# Patient Record
Sex: Male | Born: 1975 | Hispanic: No | Marital: Married | Smoking: Never smoker
Health system: Southern US, Community
[De-identification: ages and names within clinical notes are randomized; demographics above are authoritative.]

---

## 2001-06-09 ENCOUNTER — Encounter: Payer: Self-pay | Admitting: Occupational Medicine

## 2001-06-09 ENCOUNTER — Encounter: Admission: RE | Admit: 2001-06-09 | Discharge: 2001-06-09 | Payer: Self-pay | Admitting: Occupational Medicine

## 2001-09-11 ENCOUNTER — Encounter: Admission: RE | Admit: 2001-09-11 | Discharge: 2001-09-11 | Payer: Self-pay | Admitting: Specialist

## 2001-09-11 ENCOUNTER — Encounter: Payer: Self-pay | Admitting: Specialist

## 2001-11-02 ENCOUNTER — Emergency Department (HOSPITAL_COMMUNITY): Admission: EM | Admit: 2001-11-02 | Discharge: 2001-11-02 | Payer: Self-pay

## 2002-12-28 ENCOUNTER — Encounter: Admission: RE | Admit: 2002-12-28 | Discharge: 2002-12-28 | Payer: Self-pay | Admitting: Specialist

## 2002-12-28 ENCOUNTER — Encounter: Payer: Self-pay | Admitting: Specialist

## 2003-10-02 ENCOUNTER — Emergency Department (HOSPITAL_COMMUNITY): Admission: EM | Admit: 2003-10-02 | Discharge: 2003-10-02 | Payer: Self-pay | Admitting: Family Medicine

## 2004-04-03 ENCOUNTER — Emergency Department (HOSPITAL_COMMUNITY): Admission: EM | Admit: 2004-04-03 | Discharge: 2004-04-03 | Payer: Self-pay | Admitting: Emergency Medicine

## 2004-04-06 ENCOUNTER — Emergency Department (HOSPITAL_COMMUNITY): Admission: EM | Admit: 2004-04-06 | Discharge: 2004-04-06 | Payer: Self-pay | Admitting: Family Medicine

## 2004-07-13 ENCOUNTER — Encounter: Admission: RE | Admit: 2004-07-13 | Discharge: 2004-07-13 | Payer: Self-pay | Admitting: Family Medicine

## 2004-08-20 ENCOUNTER — Emergency Department (HOSPITAL_COMMUNITY): Admission: EM | Admit: 2004-08-20 | Discharge: 2004-08-21 | Payer: Self-pay | Admitting: Emergency Medicine

## 2004-10-25 ENCOUNTER — Emergency Department (HOSPITAL_COMMUNITY): Admission: AD | Admit: 2004-10-25 | Discharge: 2004-10-25 | Payer: Self-pay | Admitting: Family Medicine

## 2007-01-31 ENCOUNTER — Emergency Department (HOSPITAL_COMMUNITY): Admission: EM | Admit: 2007-01-31 | Discharge: 2007-01-31 | Payer: Self-pay | Admitting: Emergency Medicine

## 2010-09-16 ENCOUNTER — Inpatient Hospital Stay (INDEPENDENT_AMBULATORY_CARE_PROVIDER_SITE_OTHER)
Admission: RE | Admit: 2010-09-16 | Discharge: 2010-09-16 | Disposition: A | Discharge status: Home / Self Care | Payer: BC Managed Care – PPO | Source: Ambulatory Visit | Attending: Family Medicine | Admitting: Family Medicine

## 2010-09-16 DIAGNOSIS — J40 Bronchitis, not specified as acute or chronic: Secondary | ICD-10-CM

## 2010-09-16 DIAGNOSIS — R198 Other specified symptoms and signs involving the digestive system and abdomen: Secondary | ICD-10-CM

## 2011-03-26 LAB — I-STAT 8, (EC8 V) (CONVERTED LAB)
Acid-Base Excess: 3 — ABNORMAL HIGH
BUN: 12
Bicarbonate: 30.7 — ABNORMAL HIGH
Chloride: 105
Glucose, Bld: 95
HCT: 48
Hemoglobin: 16.3
Operator id: 146091
Potassium: 4.1
Sodium: 138
TCO2: 32
pCO2, Ven: 58 — ABNORMAL HIGH
pH, Ven: 7.332 — ABNORMAL HIGH

## 2011-03-26 LAB — POCT I-STAT CREATININE
Creatinine, Ser: 1.3
Operator id: 146091

## 2011-03-26 LAB — D-DIMER, QUANTITATIVE: D-Dimer, Quant: <0.22

## 2011-03-26 LAB — POCT CARDIAC MARKERS
Myoglobin, poc: 53.9
Operator id: 146091

## 2011-05-24 ENCOUNTER — Other Ambulatory Visit: Payer: Self-pay | Admitting: Gastroenterology

## 2011-05-24 DIAGNOSIS — B1910 Unspecified viral hepatitis B without hepatic coma: Secondary | ICD-10-CM

## 2011-05-26 ENCOUNTER — Other Ambulatory Visit: Payer: BC Managed Care – PPO

## 2011-05-31 ENCOUNTER — Ambulatory Visit
Admission: RE | Admit: 2011-05-31 | Discharge: 2011-05-31 | Disposition: A | Discharge status: Home / Self Care | Payer: BC Managed Care – PPO | Source: Ambulatory Visit | Attending: Gastroenterology | Admitting: Gastroenterology

## 2011-05-31 DIAGNOSIS — B1910 Unspecified viral hepatitis B without hepatic coma: Secondary | ICD-10-CM

## 2011-06-01 ENCOUNTER — Other Ambulatory Visit: Payer: BC Managed Care – PPO

## 2013-09-13 ENCOUNTER — Other Ambulatory Visit: Payer: Self-pay | Admitting: Family Medicine

## 2013-09-13 DIAGNOSIS — R809 Proteinuria, unspecified: Secondary | ICD-10-CM

## 2013-09-17 ENCOUNTER — Ambulatory Visit
Admission: RE | Admit: 2013-09-17 | Discharge: 2013-09-17 | Disposition: A | Discharge status: Home / Self Care | Payer: BC Managed Care – PPO | Source: Ambulatory Visit | Attending: Family Medicine | Admitting: Family Medicine

## 2013-09-17 DIAGNOSIS — R809 Proteinuria, unspecified: Secondary | ICD-10-CM

## 2013-09-19 ENCOUNTER — Other Ambulatory Visit: Payer: BC Managed Care – PPO

## 2015-12-14 ENCOUNTER — Ambulatory Visit (HOSPITAL_COMMUNITY)
Admission: EM | Admit: 2015-12-14 | Discharge: 2015-12-14 | Disposition: A | Discharge status: Home / Self Care | Payer: Self-pay | Attending: Emergency Medicine | Admitting: Emergency Medicine

## 2015-12-14 ENCOUNTER — Encounter (HOSPITAL_COMMUNITY): Payer: Self-pay | Admitting: Emergency Medicine

## 2015-12-14 DIAGNOSIS — K6289 Other specified diseases of anus and rectum: Secondary | ICD-10-CM | POA: Insufficient documentation

## 2015-12-14 DIAGNOSIS — N50819 Testicular pain, unspecified: Secondary | ICD-10-CM | POA: Insufficient documentation

## 2015-12-14 DIAGNOSIS — R300 Dysuria: Secondary | ICD-10-CM | POA: Insufficient documentation

## 2015-12-14 LAB — POCT URINALYSIS DIP (DEVICE)
Bilirubin Urine: NEGATIVE
GLUCOSE, UA: NEGATIVE mg/dL
Hgb urine dipstick: NEGATIVE
KETONES UR: NEGATIVE mg/dL
LEUKOCYTES UA: NEGATIVE
Nitrite: NEGATIVE
Protein, ur: 30 mg/dL — AB
SPECIFIC GRAVITY, URINE: 1.015 (ref 1.005–1.030)
UROBILINOGEN UA: 0.2 mg/dL (ref 0.0–1.0)
pH: 6 (ref 5.0–8.0)

## 2015-12-14 MED ORDER — LEVOFLOXACIN 500 MG PO TABS: 500.0000 mg | ORAL_TABLET | Freq: Every day | ORAL | Status: DC

## 2015-12-14 MED ORDER — HYDROCORTISONE ACE-PRAMOXINE 2.5-1 % EX CREA: TOPICAL_CREAM | CUTANEOUS | Status: DC

## 2015-12-14 NOTE — Discharge Instructions (Signed)
You may have an infection called epididymitis. Take Levaquin daily for 10 days. I will call you with the results of your testing in 2-3 days.  The anal pain may just be from driving long distances. I do recommend starting on a doughnut pillow. Use the cream twice a day for one week to see if that is beneficial.  If things are not improving over the next week, please follow-up with Dr. Vernie Ammonsttelin in urology.

## 2015-12-14 NOTE — ED Notes (Signed)
Testicular pain and rectal pain that started a month ago, intermittent, pain is more often now.  Denies urination issues.  Reports normal bm today.

## 2015-12-14 NOTE — ED Provider Notes (Signed)
CSN: [id number]     Arrival date & time 12/14/15  1430 History   First MD Initiated Contact with Patient 12/14/15 1519     Chief Complaint  Patient presents with  . Testicle Pain   (Consider location/radiation/quality/duration/timing/severity/associated sxs/prior Treatment) HPI He is a 40 year old man here for evaluation of testicular pain and anal pain. He states this has been going on for over a month, but has become more constant in the last 10 days. Initially, the pain was primarily in his left testicle, but now it is in both and more constant. Pain seems to be mostly at the superior aspect of the testicle. It is improved with elevating the testicles. He does report a little bit of burning with urination. The anal pain he describes as a soreness inside. He denies any constipation or straining. No known blood in the stool. 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  History reviewed. No pertinent past medical history. History reviewed. No pertinent past surgical history. No family history on file. Social History  Substance Use Topics  . Smoking status: Never Smoker   . Smokeless tobacco: None  . Alcohol Use: No    Review of Systems As in history of present illness Allergies  Review of patient's allergies indicates no known allergies.  Home Medications   Prior to Admission medications   Medication Sig Start Date End Date Taking? Authorizing Provider  Naproxen Sodium (ALEVE PO) Take by mouth.   Yes Historical Provider, MD  levofloxacin (LEVAQUIN) 500 MG tablet Take 1 tablet (500 mg total) by mouth daily. 12/14/15   Charm RingsErin J Shakiyah Cirilo, MD  Pramoxine-HC (HYDROCORTISONE ACE-PRAMOXINE) 2.5-1 % CREA Apply to rectum twice a day for 1 week. 12/14/15   Charm RingsErin J Colby Catanese, MD   Meds Ordered and Administered this Visit  Medications - No data to display  BP 137/81 mmHg  Pulse 64  Temp(Src) 98.6 F (37 C) (Oral)  Resp 16  SpO2 100% No data found.   Physical Exam  Constitutional: He  is oriented to person, place, and time. He appears well-developed and well-nourished. No distress.  Cardiovascular: Normal rate.   Pulmonary/Chest: Effort normal.  Abdominal: Hernia confirmed negative in the right inguinal area and confirmed negative in the left inguinal area.  Genitourinary: Penis normal. Rectal exam shows tenderness. Rectal exam shows no external hemorrhoid, no internal hemorrhoid, no fissure and anal tone normal. Prostate is not enlarged. Right testis shows tenderness (at superior pole). Right testis shows no mass and no swelling. Left testis shows tenderness (At superior pole). Left testis shows no mass and no swelling. Circumcised. No penile erythema or penile tenderness. No discharge found.  Lymphadenopathy:       Right: No inguinal adenopathy present.       Left: No inguinal adenopathy present.  Neurological: He is alert and oriented to person, place, and time.    ED Course  Procedures (including critical care time)  Labs Review Labs Reviewed  POCT URINALYSIS DIP (DEVICE) - Abnormal; Notable for the following:    Protein, ur 30 (*)    All other components within normal limits  URINE CULTURE  URINE CYTOLOGY ANCILLARY ONLY    Imaging Review No results found.   MDM   1. Testicular pain   2. Anal pain    As the pain is primarily over the epididymis and he does report some slight dysuria, will treat for epididymitis with Levaquin for 10 days. No obvious cause of his anal pain on  exam. Will trial hydrocortisone-pramoxine cream for 1 week.  Also recommended adding on a donut pillow. If symptoms are not improving, follow-up with urology.    Charm RingsErin J Xander Jutras, MD 12/14/15 [phone]

## 2015-12-15 LAB — URINE CYTOLOGY ANCILLARY ONLY
Chlamydia: NEGATIVE
NEISSERIA GONORRHEA: NEGATIVE
TRICH (WINDOWPATH): NEGATIVE

## 2015-12-15 LAB — URINE CULTURE: Culture: NO GROWTH

## 2015-12-20 ENCOUNTER — Telehealth (HOSPITAL_COMMUNITY): Payer: Self-pay | Admitting: Emergency Medicine

## 2015-12-20 NOTE — ED Notes (Signed)
Called pt and notified of recent lab results from visit 7/2 Pt ID'd properly... Reports feeling better but still having mild pain Also states he has not started the antibiotics b/c of problems w/pharmacy Adv pt to start the antibiotics as soon as he can and to see how is doing after treatment. Adv pt if sx are not getting better to return or to f/u w/urologist.  Pt verb understanding  Per Dr. Piedad ClimesHonig,   Notes Recorded by Charm RingsErin J Honig, MD on 12/16/2015 at 11:01 AM STD testing and urine culture are negative. If he has persistent pain after completing antibiotics, he should f/u with urology

## 2017-08-11 ENCOUNTER — Other Ambulatory Visit (HOSPITAL_COMMUNITY): Payer: Self-pay | Admitting: Gastroenterology

## 2017-08-11 DIAGNOSIS — B181 Chronic viral hepatitis B without delta-agent: Secondary | ICD-10-CM

## 2017-09-21 ENCOUNTER — Encounter (HOSPITAL_COMMUNITY): Payer: Self-pay

## 2017-09-21 ENCOUNTER — Ambulatory Visit (HOSPITAL_COMMUNITY): Payer: BLUE CROSS/BLUE SHIELD

## 2017-09-27 ENCOUNTER — Ambulatory Visit (HOSPITAL_COMMUNITY): Payer: BLUE CROSS/BLUE SHIELD

## 2017-09-30 ENCOUNTER — Ambulatory Visit (HOSPITAL_COMMUNITY): Payer: BLUE CROSS/BLUE SHIELD

## 2017-10-07 ENCOUNTER — Ambulatory Visit (HOSPITAL_COMMUNITY)
Admission: RE | Admit: 2017-10-07 | Discharge: 2017-10-07 | Disposition: A | Discharge status: Home / Self Care | Payer: BLUE CROSS/BLUE SHIELD | Source: Ambulatory Visit | Attending: Gastroenterology | Admitting: Gastroenterology

## 2017-10-07 DIAGNOSIS — B181 Chronic viral hepatitis B without delta-agent: Secondary | ICD-10-CM | POA: Diagnosis present

## 2017-11-20 ENCOUNTER — Other Ambulatory Visit: Payer: Self-pay | Admitting: Nurse Practitioner

## 2017-11-20 DIAGNOSIS — N644 Mastodynia: Secondary | ICD-10-CM

## 2017-11-20 DIAGNOSIS — N63 Unspecified lump in unspecified breast: Secondary | ICD-10-CM

## 2017-11-23 ENCOUNTER — Ambulatory Visit
Admission: RE | Admit: 2017-11-23 | Discharge: 2017-11-23 | Disposition: A | Discharge status: Home / Self Care | Payer: BLUE CROSS/BLUE SHIELD | Source: Ambulatory Visit | Attending: Nurse Practitioner | Admitting: Nurse Practitioner

## 2017-11-23 ENCOUNTER — Ambulatory Visit: Payer: BLUE CROSS/BLUE SHIELD

## 2017-11-23 DIAGNOSIS — N644 Mastodynia: Secondary | ICD-10-CM

## 2017-11-23 DIAGNOSIS — N63 Unspecified lump in unspecified breast: Secondary | ICD-10-CM

## 2017-11-25 ENCOUNTER — Other Ambulatory Visit: Payer: BLUE CROSS/BLUE SHIELD

## 2018-05-30 ENCOUNTER — Other Ambulatory Visit: Payer: Self-pay | Admitting: Gastroenterology

## 2018-05-30 DIAGNOSIS — B181 Chronic viral hepatitis B without delta-agent: Secondary | ICD-10-CM

## 2018-05-30 DIAGNOSIS — R772 Abnormality of alphafetoprotein: Secondary | ICD-10-CM

## 2018-07-29 ENCOUNTER — Inpatient Hospital Stay: Admission: RE | Admit: 2018-07-29 | Payer: BLUE CROSS/BLUE SHIELD | Source: Ambulatory Visit

## 2019-01-26 ENCOUNTER — Other Ambulatory Visit: Payer: BLUE CROSS/BLUE SHIELD

## 2019-07-27 ENCOUNTER — Other Ambulatory Visit: Payer: Self-pay | Admitting: Gastroenterology

## 2019-07-27 DIAGNOSIS — R772 Abnormality of alphafetoprotein: Secondary | ICD-10-CM

## 2019-07-27 DIAGNOSIS — B181 Chronic viral hepatitis B without delta-agent: Secondary | ICD-10-CM

## 2019-08-06 ENCOUNTER — Other Ambulatory Visit: Payer: Self-pay | Admitting: Gastroenterology

## 2019-08-06 DIAGNOSIS — R772 Abnormality of alphafetoprotein: Secondary | ICD-10-CM

## 2020-06-25 ENCOUNTER — Other Ambulatory Visit (HOSPITAL_COMMUNITY): Payer: Self-pay | Admitting: Gastroenterology

## 2020-06-25 ENCOUNTER — Other Ambulatory Visit: Payer: Self-pay | Admitting: Gastroenterology

## 2020-06-25 DIAGNOSIS — B181 Chronic viral hepatitis B without delta-agent: Secondary | ICD-10-CM

## 2020-06-25 DIAGNOSIS — R772 Abnormality of alphafetoprotein: Secondary | ICD-10-CM

## 2020-06-28 ENCOUNTER — Ambulatory Visit (HOSPITAL_COMMUNITY)
Admission: RE | Admit: 2020-06-28 | Discharge: 2020-06-28 | Disposition: A | Discharge status: Home / Self Care | Payer: 59 | Source: Ambulatory Visit | Attending: Gastroenterology | Admitting: Gastroenterology

## 2020-06-28 ENCOUNTER — Other Ambulatory Visit: Payer: Self-pay

## 2020-06-28 DIAGNOSIS — B181 Chronic viral hepatitis B without delta-agent: Secondary | ICD-10-CM | POA: Insufficient documentation

## 2020-06-28 DIAGNOSIS — R772 Abnormality of alphafetoprotein: Secondary | ICD-10-CM | POA: Diagnosis present

## 2020-06-28 MED ORDER — GADOBUTROL 1 MMOL/ML IV SOLN
10.0000 mL | Freq: Once | INTRAVENOUS | Status: AC | PRN
  Administered 2020-06-28: 10 mL via INTRAVENOUS

## 2022-02-21 IMAGING — MR MR ABDOMEN WO/W CM
18 series · 47 of 48 positions shown · IV contrast (10mL GADAVIST)
Comparison: 10/07/2017 abdominal sonogram. 05/14/2005 CT
abdomen/pelvis.

CLINICAL DATA: Chronic hepatitis-B. Elevated AFP. No acute symptoms
reported.

EXAM:
MRI ABDOMEN WITHOUT AND WITH CONTRAST
TECHNIQUE: Multiplanar multisequence MR imaging of the abdomen was performed
both before and after the administration of intravenous contrast.
CONTRAST:  10mL GADAVIST GADOBUTROL 1 MMOL/ML IV SOLN

[Series 3: T2 · coronal · 6.0mm · 1.56mm/px · 2 of 40 slices shown (1 of 2)]
[im 1/40]
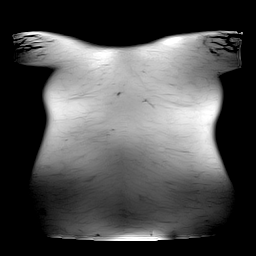
[im 40/40]
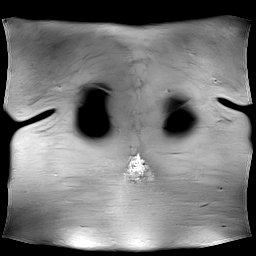

[Series 4: T2 fat-sat · axial · 6.0mm · 1.25mm/px · z∈[-113,+168]mm · 2 of 40 slices shown]
[im 1/40]
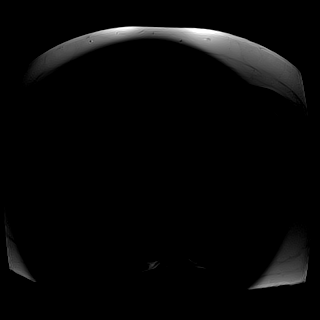
[im 40/40]
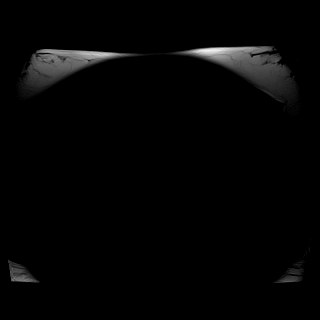

[Series 6: T1 · axial · 3.0mm · 1.25mm/px · z∈[-91,+170]mm · 4 of 88 slices shown (1 of 2)]
[im 1/88]
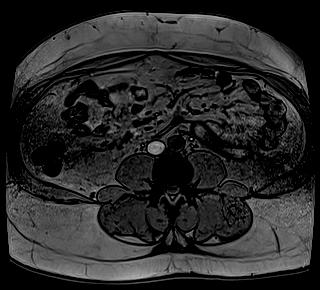
[im 30/88]
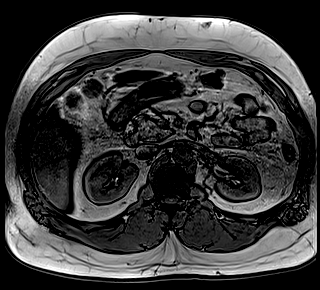
[im 59/88]
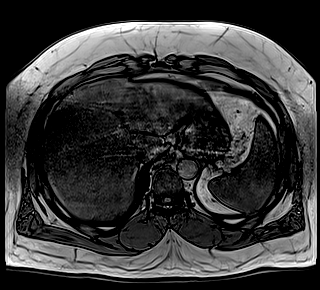
[im 88/88]
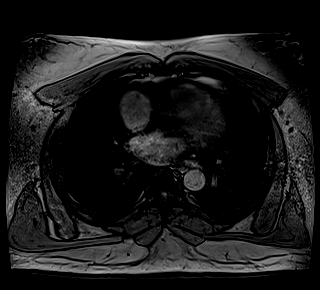

[Series 7: T1 · axial · 3.0mm · 1.25mm/px · z∈[-91,+170]mm · 3 of 88 slices shown (2 of 2)]
[im 1/88]
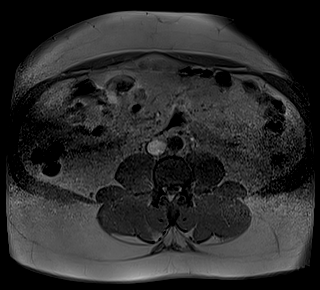
[im 44/88]
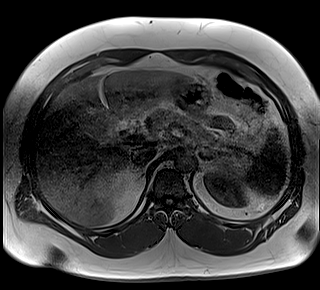
[im 88/88]
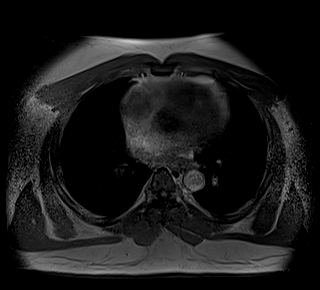

[Series 8: DWI · axial · 6.0mm · 1.49mm/px · z∈[-101,+151]mm · 3 of 72 slices shown (1 of 2)]
[im 1/72]
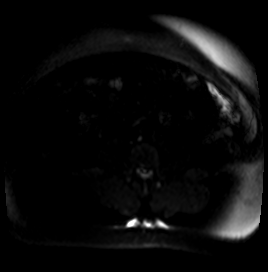
[im 36/72]
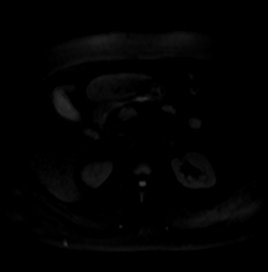
[im 72/72]
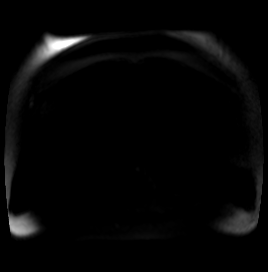

[Series 9: DWI · axial · 6.0mm · 1.49mm/px · 1 of 36 slices shown (2 of 2)]
[im 1/36]
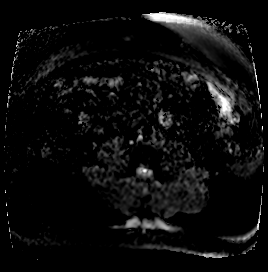

[Series 10: bSSFP · axial · 4.0mm · 0.78mm/px · z∈[-107,+153]mm · 2 of 66 slices shown]
[im 1/66]
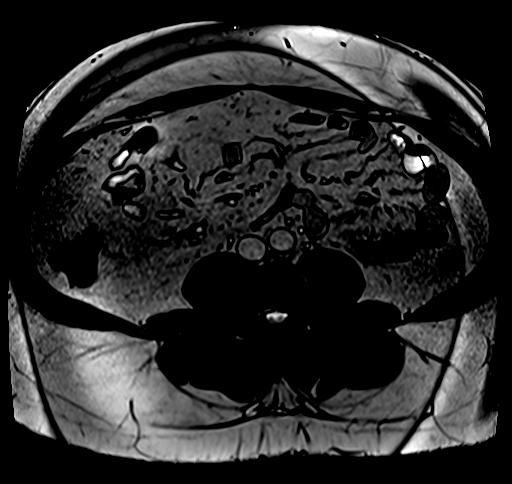
[im 66/66]
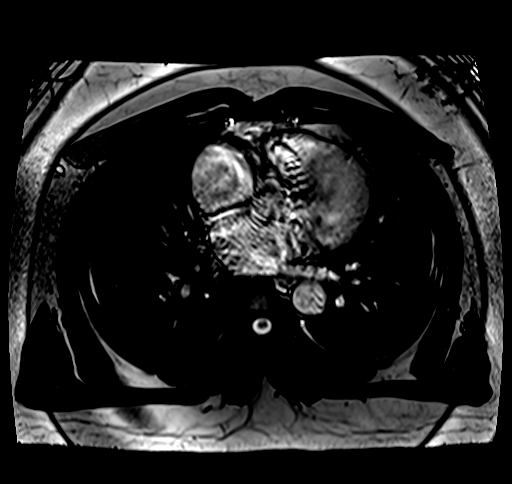

[Series 12: T1 dynamic · axial · 3.0mm · 1.25mm/px · z∈[-99,+162]mm · 3 of 88 slices shown (1 of 10)]
[im 1/88]
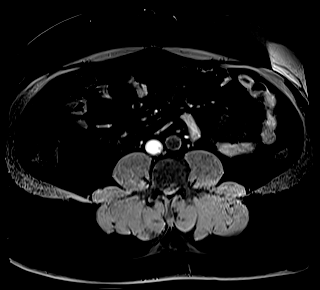
[im 44/88]
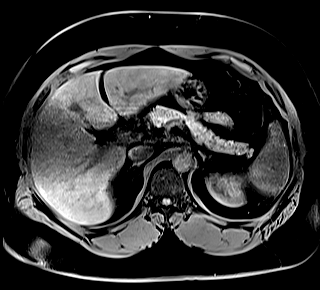
[im 88/88]
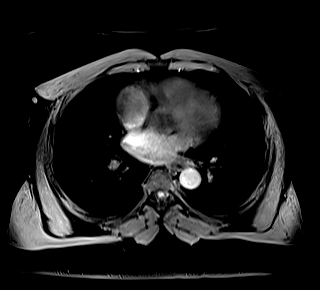

[Series 16: T1 dynamic · axial · 3.0mm · 1.25mm/px · z∈[-99,+162]mm · 3 of 88 slices shown (2 of 10)]
[im 1/88]
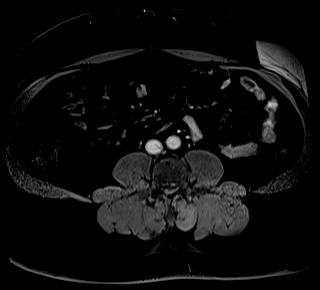
[im 44/88]
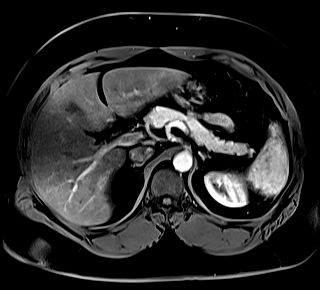
[im 88/88]
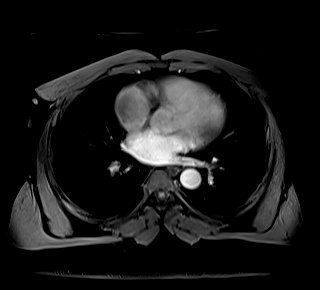

[Series 17: T1 dynamic · axial · 3.0mm · 1.25mm/px · z∈[-99,+162]mm · 3 of 88 slices shown (3 of 10)]
[im 1/88]
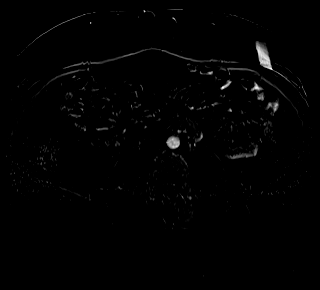
[im 44/88]
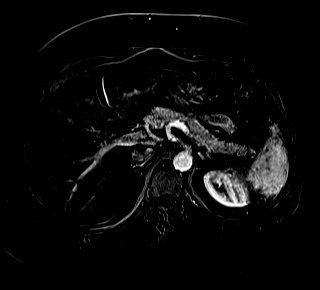
[im 88/88]
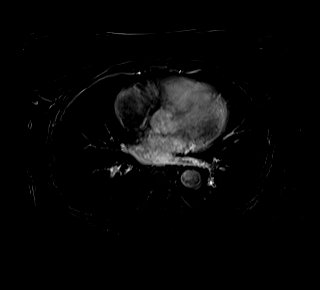

[Series 20: T1 dynamic · axial · 3.0mm · 1.25mm/px · z∈[-99,+162]mm · 3 of 88 slices shown (4 of 10)]
[im 1/88]
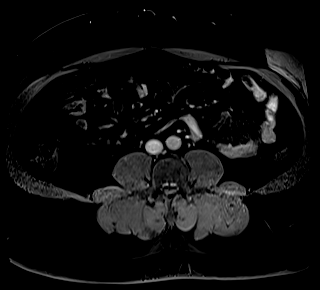
[im 44/88]
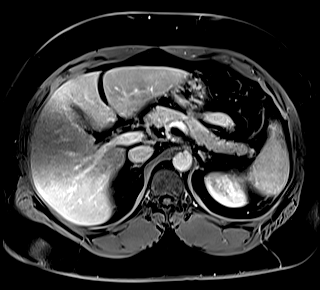
[im 88/88]
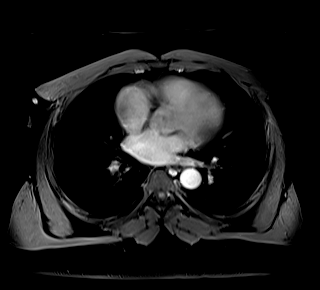

[Series 21: T1 dynamic · axial · 3.0mm · 1.25mm/px · z∈[-99,+162]mm · 3 of 88 slices shown (5 of 10)]
[im 1/88]
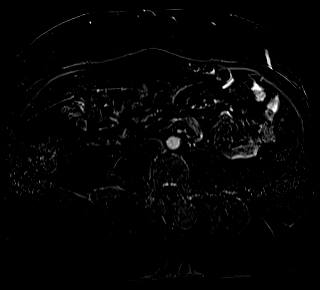
[im 44/88]
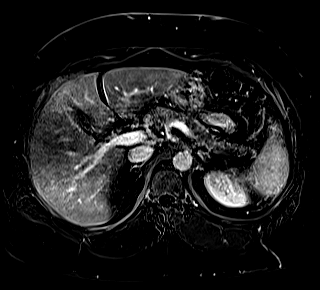
[im 88/88]
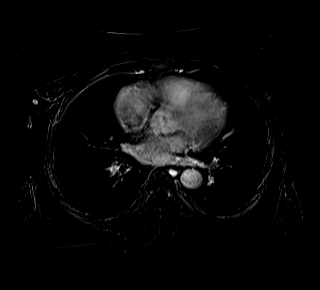

[Series 24: T1 dynamic · axial · 3.0mm · 1.25mm/px · z∈[-99,+162]mm · 3 of 88 slices shown (6 of 10)]
[im 1/88]
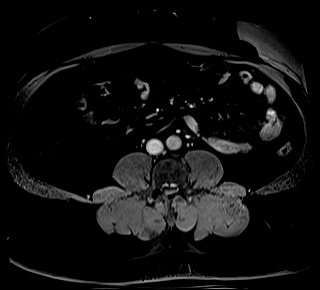
[im 44/88]
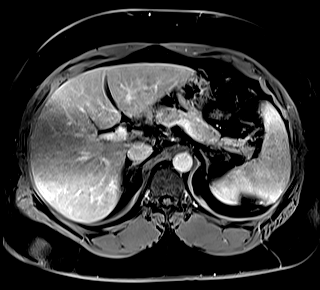
[im 88/88]
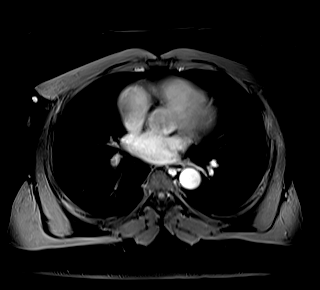

[Series 25: T1 dynamic · axial · 3.0mm · 1.25mm/px · z∈[-99,+162]mm · 3 of 88 slices shown (7 of 10)]
[im 1/88]
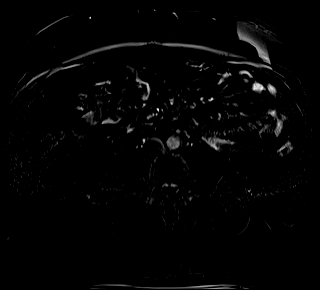
[im 44/88]
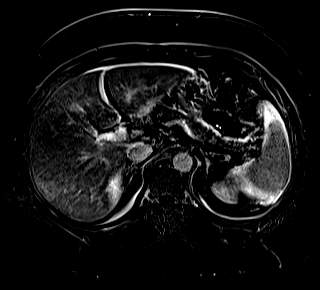
[im 88/88]
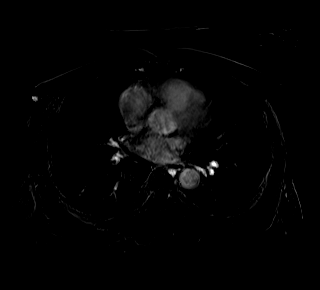

[Series 27: T1 dynamic · coronal · 4.0mm · 1.25mm/px · 3 of 72 slices shown (8 of 10)]
[im 1/72]
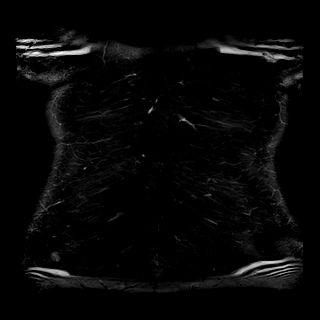
[im 36/72]
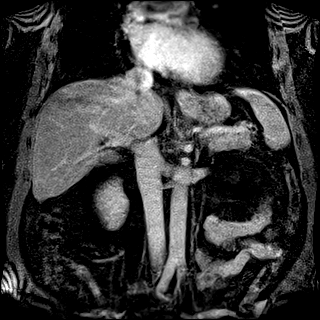
[im 72/72]
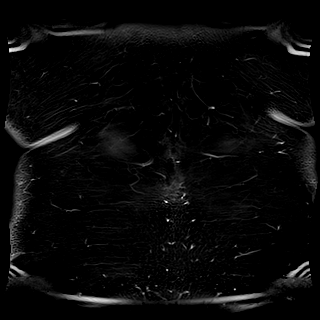

[Series 28: T2 · axial · 6.0mm · 1.56mm/px · 1 of 37 slices shown (2 of 2)]
[im 1/37]
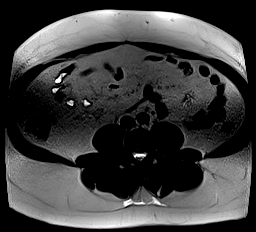

[Series 31: T1 dynamic · axial · 3.0mm · 1.25mm/px · z∈[-99,+162]mm · 3 of 88 slices shown (9 of 10)]
[im 1/88]
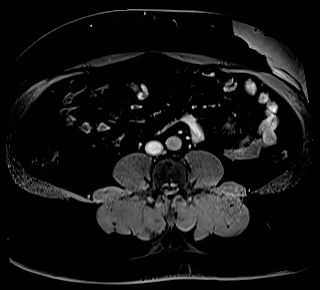
[im 44/88]
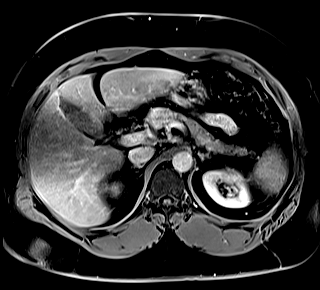
[im 88/88]
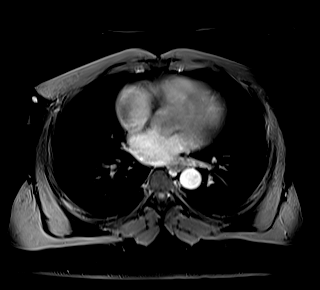

[Series 32: T1 dynamic · axial · 3.0mm · 1.25mm/px · z∈[-99,+30]mm · 2 of 88 slices shown (10 of 10)]
[im 1/88]
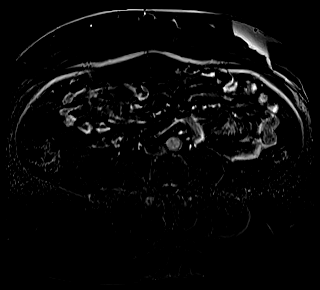
[im 44/88]
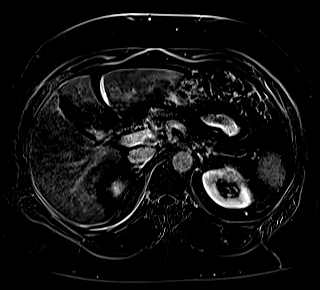

[47 of 48 positions shown; findings below may reference images not displayed]

FINDINGS: Lower chest: No acute abnormality at the lung bases.

Hepatobiliary: Normal liver size. Mildly heterogeneous liver
parenchyma. No definite liver surface irregularity. Mild diffuse
hepatic steatosis. No liver mass. Normal gallbladder with no
cholelithiasis. No biliary ductal dilatation. Common bile duct
diameter 2 mm. No choledocholithiasis. No biliary masses, strictures
or beading.

Pancreas: No pancreatic mass or duct dilation.  No pancreas divisum.

Spleen: Normal size. No mass.

Adrenals/Urinary Tract: Normal adrenals. No hydronephrosis. Normal
kidneys with no renal mass.

Stomach/Bowel: Normal non-distended stomach. Visualized small and
large bowel is normal caliber, with no bowel wall thickening.

Vascular/Lymphatic: Normal caliber abdominal aorta. Patent portal,
splenic, hepatic and renal veins. No pathologically enlarged lymph
nodes in the abdomen.

Other: No abdominal ascites or focal fluid collection.

Musculoskeletal: No aggressive appearing focal osseous lesions.
IMPRESSION: 1. Mild diffuse hepatic steatosis. No overt morphologic changes of
cirrhosis. No liver mass.
2. Normal gallbladder with no cholelithiasis. No biliary ductal
dilatation. No choledocholithiasis.

## 2022-02-23 ENCOUNTER — Encounter (HOSPITAL_COMMUNITY): Payer: Self-pay

## 2022-02-23 ENCOUNTER — Ambulatory Visit (HOSPITAL_COMMUNITY)
Admission: EM | Admit: 2022-02-23 | Discharge: 2022-02-23 | Disposition: A | Discharge status: Home / Self Care | Payer: 59 | Attending: Family Medicine | Admitting: Family Medicine

## 2022-02-23 ENCOUNTER — Ambulatory Visit (INDEPENDENT_AMBULATORY_CARE_PROVIDER_SITE_OTHER): Payer: 59

## 2022-02-23 DIAGNOSIS — S93402A Sprain of unspecified ligament of left ankle, initial encounter: Secondary | ICD-10-CM | POA: Diagnosis not present

## 2022-02-23 DIAGNOSIS — M25572 Pain in left ankle and joints of left foot: Secondary | ICD-10-CM

## 2022-02-23 DIAGNOSIS — M79672 Pain in left foot: Secondary | ICD-10-CM | POA: Diagnosis not present

## 2022-02-23 MED ORDER — ACETAMINOPHEN 325 MG PO TABS
ORAL_TABLET | ORAL | Status: AC
  Filled 2022-02-23: qty 3

## 2022-02-23 MED ORDER — NAPROXEN 500 MG PO TABS: 500.0000 mg | ORAL_TABLET | Freq: Two times a day (BID) | ORAL | 0 refills | Status: DC

## 2022-02-23 MED ORDER — ACETAMINOPHEN 325 MG PO TABS
975.0000 mg | ORAL_TABLET | Freq: Once | ORAL | Status: AC
  Administered 2022-02-23: 975 mg via ORAL

## 2022-02-23 NOTE — Discharge Instructions (Addendum)
Your x-rays of your ankle were negative for acute bony abnormality and fracture. I suspect that you have sprained your ankle. Rest, ice, compress, and elevate your left ankle for the next few days to reduce the swelling.  Continue taking naproxen (Aleve) to reduce swelling and inflammation to the ankle.  I have sent this medication to the pharmacy for you to pick up.  You may also take Tylenol every 6 hours as needed.  We gave you a dose of Tylenol in the clinic so your next dose may be at 6:30 PM tonight if needed.  Wear the ankle brace provided in the clinic for the next few days to provide stability and compression to the ankle.  Call the orthopedic doctor listed on your paperwork for a follow-up appointment if needed if symptoms do not improve in the next week.   If you develop any new or worsening symptoms or do not improve in the next 2 to 3 days, please return.  If your symptoms are severe, please go to the emergency room.  Follow-up with your primary care provider for further evaluation and management of your symptoms as well as ongoing wellness visits.  I hope you feel better!

## 2022-02-23 NOTE — ED Provider Notes (Signed)
[facility]    CSN: [id number] Arrival date & time: 02/23/22  1005      History   Chief Complaint Chief Complaint  Patient presents with   Foot Pain    HPI William Gordon is a 46 y.o. male.   Patient presents urgent care for evaluation of left foot pain that started on Sunday, February 21, 2022 after he was sitting on his foot during prayer at his [employer].  Patient states that while he was sitting on his left foot to pray, he may have heard a "pop" or twisted his ankle the wrong way.  He is currently having pain to the inferior malleolus of the left ankle that is worse with weightbearing.  Pain at rest without weightbearing is currently a 4 on a scale of 0-10 to the left inferior ankle and is an 8 on a scale of 0-10 with weightbearing activity.  Left ankle is significantly swollen compared to the right.  No numbness or tingling to the bilateral lower extremities.  Patient denies redness/rash to the lower extremities.  No recent long periods of travel or sitting reported.  No shortness of breath, chest pain, recent falls, or fever/chills.  He has been taking Aleve every 12 hours at home as needed for the pain which is taken the edge off of the pain but has not helped significantly.  Last dose of Aleve was this morning at approximately 8 AM. Denies history of gouty arthritis/intake of red meat. No other triggering or relieving factors identified at this time for patient symptoms.   Foot Pain    History reviewed. No pertinent past medical history.  There are no problems to display for this patient.   History reviewed. No pertinent surgical history.     Home Medications    Prior to Admission medications   Medication Sig Start Date End Date Taking? Authorizing Provider  naproxen (NAPROSYN) 500 MG tablet Take 1 tablet (500 mg total) by mouth 2 (two) times daily. 02/23/22  Yes Carlisle Beers, FNP  levofloxacin (LEVAQUIN) 500 MG tablet Take 1 tablet (500 mg  total) by mouth daily. 12/14/15   Charm Rings, MD  Pramoxine-HC (HYDROCORTISONE ACE-PRAMOXINE) 2.5-1 % CREA Apply to rectum twice a day for 1 week. 12/14/15   Charm Rings, MD    Family History History reviewed. No pertinent family history.  Social History Social History   Tobacco Use   Smoking status: Never  Substance Use Topics   Alcohol use: No   Drug use: No     Allergies   Patient has no known allergies.   Review of Systems Review of Systems Per HPI  Physical Exam Triage Vital Signs ED Triage Vitals [02/23/22 1145]  Enc Vitals Group     BP (!) 133/94     Pulse Rate 90     Resp 16     Temp 99.4 F (37.4 C)     Temp Source Oral     SpO2 94 %     Weight      Height      Head Circumference      Peak Flow      Pain Score 8     Pain Loc      Pain Edu?      Excl. in GC?    No data found.  Updated Vital Signs BP (!) 133/94 (BP Location: Left Arm)   Pulse 90   Temp 99.4 F (37.4 C) (Oral)   Resp  16   SpO2 94%   Visual Acuity Right Eye Distance:   Left Eye Distance:   Bilateral Distance:    Right Eye Near:   Left Eye Near:    Bilateral Near:     Physical Exam Vitals and nursing note reviewed.  Constitutional:      Appearance: Normal appearance. He is not ill-appearing or toxic-appearing.     Comments: Very pleasant patient sitting on exam in position of comfort table in no acute distress.   HENT:     Head: Normocephalic and atraumatic.     Right Ear: Hearing and external ear normal.     Left Ear: Hearing and external ear normal.     Nose: Nose normal.     Mouth/Throat:     Lips: Pink.     Mouth: Mucous membranes are moist.  Eyes:     General: Lids are normal. Vision grossly intact. Gaze aligned appropriately.     Extraocular Movements: Extraocular movements intact.     Conjunctiva/sclera: Conjunctivae normal.  Pulmonary:     Effort: Pulmonary effort is normal.  Abdominal:     Palpations: Abdomen is soft.  Musculoskeletal:         General: Tenderness present.     Cervical back: Neck supple.     Comments: Left foot/ankle: Mild TTP of the left inferior malleolus/ankle.  Left ankle is obviously swollen without pitting edema.  No obvious redness or rash present.  No significant warmth present to the left ankle as compared to the right.  No abrasions or lacerations present to inspection.  5/5 strength with dorsi flexion and plantarflexion against resistance with full range of motion of the left ankle that is not limited by pain.  Able to move all 5 toes of left foot voluntarily.  Sensation is intact.  +2 dorsalis pedis and anterior tibialis pulses present.  Capillary refill less than 3.  Skin:    General: Skin is warm and dry.     Capillary Refill: Capillary refill takes less than 2 seconds.     Findings: No rash.  Neurological:     General: No focal deficit present.     Mental Status: He is alert and oriented to person, place, and time. Mental status is at baseline.     Cranial Nerves: No dysarthria or facial asymmetry.     Gait: Gait is intact.  Psychiatric:        Mood and Affect: Mood normal.        Speech: Speech normal.        Behavior: Behavior normal.        Thought Content: Thought content normal.        Judgment: Judgment normal.      UC Treatments / Results  Labs (all labs ordered are listed, but only abnormal results are displayed) Labs Reviewed - No data to display  EKG   Radiology DG Ankle Complete Left  Result Date: 02/23/2022 CLINICAL DATA:  Left foot pain and swelling. EXAM: LEFT ANKLE COMPLETE - 3+ VIEW COMPARISON:  None Available. FINDINGS: There is no evidence of fracture, dislocation, or joint effusion. There is no evidence of arthropathy or other focal bone abnormality. There is marked soft tissue swelling about the ankle. No radiopaque foreign body. IMPRESSION: 1.  No acute fracture or dislocation. 2. Marked soft tissue swelling about the ankle without evidence of radiopaque foreign body.  Electronically Signed   By: Larose Hires D.O.   On: 02/23/2022 12:37    Procedures  Procedures (including critical care time)  Medications Ordered in UC Medications  acetaminophen (TYLENOL) tablet 975 mg (975 mg Oral Given 02/23/22 1235)    Initial Impression / Assessment and Plan / UC Course  I have reviewed the triage vital signs and the nursing notes.  Pertinent labs & imaging results that were available during my care of the patient were reviewed by me and considered in my medical decision making (see chart for details).   1. Sprain of left ankle X-ray of left ankle shows diffuse soft tissue swelling to the left ankle without evidence of acute bony abnormality.  Doubt acute gouty arthritis/clotting process causing patient's symptoms at this time.  Symptoms and physical exam more consistent with acute sprain of left ankle related to long period of sitting on the left ankle during prayer. We will manage this with RICE, continued NSAIDS (Aleve), ankle brace.  Tylenol given in clinic and may be used every 6 hours as needed at home for ongoing pain and inflammation as well.  Patient to wear ankle brace for the next few days for stability and compression. Walking referral to orthopedics given to be used if symptoms do not improve in the next week. Patient agreeable with plan.   Discussed physical exam and available lab work findings in clinic with patient.  Counseled patient regarding appropriate use of medications and potential side effects for all medications recommended or prescribed today. Discussed red flag signs and symptoms of worsening condition,when to call the PCP office, return to urgent care, and when to seek higher level of care in the emergency department. Patient verbalizes understanding and agreement with plan. All questions answered. Patient discharged in stable condition.  Final Clinical Impressions(s) / UC Diagnoses   Final diagnoses:  Sprain of left ankle, unspecified ligament,  initial encounter     Discharge Instructions      Your x-rays of your ankle were negative for acute bony abnormality and fracture. I suspect that you have sprained your ankle. Rest, ice, compress, and elevate your left ankle for the next few days to reduce the swelling.  Continue taking naproxen (Aleve) to reduce swelling and inflammation to the ankle.  I have sent this medication to the pharmacy for you to pick up.  You may also take Tylenol every 6 hours as needed.  We gave you a dose of Tylenol in the clinic so your next dose may be at 6:30 PM tonight if needed.  Wear the ankle brace provided in the clinic for the next few days to provide stability and compression to the ankle.  Call the orthopedic doctor listed on your paperwork for a follow-up appointment if needed if symptoms do not improve in the next week.   If you develop any new or worsening symptoms or do not improve in the next 2 to 3 days, please return.  If your symptoms are severe, please go to the emergency room.  Follow-up with your primary care provider for further evaluation and management of your symptoms as well as ongoing wellness visits.  I hope you feel better!     ED Prescriptions     Medication Sig Dispense Auth. Provider   naproxen (NAPROSYN) 500 MG tablet Take 1 tablet (500 mg total) by mouth 2 (two) times daily. 30 tablet Carlisle Beers, FNP      PDMP not reviewed this encounter.   Carlisle Beers, [place] 02/25/22 1904

## 2022-02-23 NOTE — ED Triage Notes (Signed)
Left foot pain. Patient walking with a limp, onset Saturday night. Patient is [employer], sits on the foot in his prayer position. Foot is swollen.   No known falls or injuries.

## 2022-04-12 DIAGNOSIS — M79672 Pain in left foot: Secondary | ICD-10-CM | POA: Diagnosis not present

## 2022-05-17 ENCOUNTER — Other Ambulatory Visit (HOSPITAL_COMMUNITY): Payer: Self-pay | Admitting: Gastroenterology

## 2022-05-17 DIAGNOSIS — B181 Chronic viral hepatitis B without delta-agent: Secondary | ICD-10-CM

## 2022-05-17 DIAGNOSIS — R69 Illness, unspecified: Secondary | ICD-10-CM | POA: Diagnosis not present

## 2022-05-22 ENCOUNTER — Ambulatory Visit (HOSPITAL_COMMUNITY)
Admission: RE | Admit: 2022-05-22 | Discharge: 2022-05-22 | Disposition: A | Discharge status: Home / Self Care | Payer: 59 | Source: Ambulatory Visit | Attending: Gastroenterology | Admitting: Gastroenterology

## 2022-05-22 DIAGNOSIS — R69 Illness, unspecified: Secondary | ICD-10-CM | POA: Diagnosis not present

## 2022-05-22 DIAGNOSIS — B181 Chronic viral hepatitis B without delta-agent: Secondary | ICD-10-CM | POA: Insufficient documentation

## 2022-05-25 ENCOUNTER — Other Ambulatory Visit (HOSPITAL_COMMUNITY): Payer: Self-pay | Admitting: Gastroenterology

## 2022-05-25 DIAGNOSIS — R935 Abnormal findings on diagnostic imaging of other abdominal regions, including retroperitoneum: Secondary | ICD-10-CM

## 2022-05-25 DIAGNOSIS — B181 Chronic viral hepatitis B without delta-agent: Secondary | ICD-10-CM

## 2022-06-06 ENCOUNTER — Ambulatory Visit (HOSPITAL_COMMUNITY)
Admission: RE | Admit: 2022-06-06 | Discharge: 2022-06-06 | Disposition: A | Discharge status: Home / Self Care | Payer: 59 | Source: Ambulatory Visit | Attending: Gastroenterology | Admitting: Gastroenterology

## 2022-06-06 ENCOUNTER — Other Ambulatory Visit (HOSPITAL_COMMUNITY): Payer: Self-pay | Admitting: Gastroenterology

## 2022-06-06 DIAGNOSIS — R935 Abnormal findings on diagnostic imaging of other abdominal regions, including retroperitoneum: Secondary | ICD-10-CM

## 2022-06-06 DIAGNOSIS — R69 Illness, unspecified: Secondary | ICD-10-CM | POA: Diagnosis not present

## 2022-06-06 DIAGNOSIS — B181 Chronic viral hepatitis B without delta-agent: Secondary | ICD-10-CM | POA: Insufficient documentation

## 2022-06-06 MED ORDER — GADOBUTROL 1 MMOL/ML IV SOLN
10.0000 mL | Freq: Once | INTRAVENOUS | Status: AC | PRN
  Administered 2022-06-06: 10 mL via INTRAVENOUS

## 2023-01-14 DIAGNOSIS — K219 Gastro-esophageal reflux disease without esophagitis: Secondary | ICD-10-CM | POA: Diagnosis present

## 2023-02-07 DIAGNOSIS — I10 Essential (primary) hypertension: Secondary | ICD-10-CM | POA: Diagnosis present

## 2023-02-18 LAB — COLOGUARD: COLOGUARD: NEGATIVE

## 2024-02-16 ENCOUNTER — Emergency Department (HOSPITAL_COMMUNITY)

## 2024-02-16 ENCOUNTER — Encounter (HOSPITAL_COMMUNITY): Payer: Self-pay | Admitting: Emergency Medicine

## 2024-02-16 ENCOUNTER — Other Ambulatory Visit: Payer: Self-pay

## 2024-02-16 ENCOUNTER — Inpatient Hospital Stay (HOSPITAL_COMMUNITY)
Admission: EM | Admit: 2024-02-16 | Discharge: 2024-02-21 | DRG: 433 | Disposition: A | Discharge status: Home / Self Care | Attending: Family Medicine | Admitting: Family Medicine

## 2024-02-16 DIAGNOSIS — E8809 Other disorders of plasma-protein metabolism, not elsewhere classified: Secondary | ICD-10-CM | POA: Diagnosis not present

## 2024-02-16 DIAGNOSIS — K5900 Constipation, unspecified: Secondary | ICD-10-CM | POA: Diagnosis not present

## 2024-02-16 DIAGNOSIS — K921 Melena: Secondary | ICD-10-CM | POA: Diagnosis present

## 2024-02-16 DIAGNOSIS — Z91199 Patient's noncompliance with other medical treatment and regimen due to unspecified reason: Secondary | ICD-10-CM | POA: Diagnosis not present

## 2024-02-16 DIAGNOSIS — N179 Acute kidney failure, unspecified: Secondary | ICD-10-CM | POA: Diagnosis present

## 2024-02-16 DIAGNOSIS — R5381 Other malaise: Secondary | ICD-10-CM | POA: Diagnosis not present

## 2024-02-16 DIAGNOSIS — Z91014 Allergy to mammalian meats: Secondary | ICD-10-CM

## 2024-02-16 DIAGNOSIS — R112 Nausea with vomiting, unspecified: Secondary | ICD-10-CM | POA: Diagnosis present

## 2024-02-16 DIAGNOSIS — K219 Gastro-esophageal reflux disease without esophagitis: Secondary | ICD-10-CM | POA: Diagnosis not present

## 2024-02-16 DIAGNOSIS — L299 Pruritus, unspecified: Secondary | ICD-10-CM | POA: Diagnosis present

## 2024-02-16 DIAGNOSIS — I10 Essential (primary) hypertension: Secondary | ICD-10-CM | POA: Diagnosis present

## 2024-02-16 DIAGNOSIS — R609 Edema, unspecified: Secondary | ICD-10-CM | POA: Diagnosis not present

## 2024-02-16 DIAGNOSIS — K7460 Unspecified cirrhosis of liver: Principal | ICD-10-CM | POA: Diagnosis present

## 2024-02-16 DIAGNOSIS — B54 Unspecified malaria: Secondary | ICD-10-CM

## 2024-02-16 DIAGNOSIS — R509 Fever, unspecified: Secondary | ICD-10-CM | POA: Diagnosis present

## 2024-02-16 DIAGNOSIS — B181 Chronic viral hepatitis B without delta-agent: Secondary | ICD-10-CM | POA: Diagnosis not present

## 2024-02-16 DIAGNOSIS — R7989 Other specified abnormal findings of blood chemistry: Secondary | ICD-10-CM | POA: Diagnosis not present

## 2024-02-16 DIAGNOSIS — Z6841 Body Mass Index (BMI) 40.0 and over, adult: Secondary | ICD-10-CM

## 2024-02-16 DIAGNOSIS — Z8613 Personal history of malaria: Secondary | ICD-10-CM

## 2024-02-16 DIAGNOSIS — K7689 Other specified diseases of liver: Secondary | ICD-10-CM | POA: Diagnosis not present

## 2024-02-16 DIAGNOSIS — R932 Abnormal findings on diagnostic imaging of liver and biliary tract: Secondary | ICD-10-CM | POA: Diagnosis not present

## 2024-02-16 DIAGNOSIS — D649 Anemia, unspecified: Secondary | ICD-10-CM | POA: Diagnosis present

## 2024-02-16 DIAGNOSIS — R17 Unspecified jaundice: Principal | ICD-10-CM | POA: Diagnosis present

## 2024-02-16 DIAGNOSIS — R531 Weakness: Secondary | ICD-10-CM | POA: Diagnosis not present

## 2024-02-16 DIAGNOSIS — R5383 Other fatigue: Secondary | ICD-10-CM | POA: Diagnosis not present

## 2024-02-16 DIAGNOSIS — R008 Other abnormalities of heart beat: Secondary | ICD-10-CM | POA: Diagnosis not present

## 2024-02-16 DIAGNOSIS — R9431 Abnormal electrocardiogram [ECG] [EKG]: Secondary | ICD-10-CM | POA: Diagnosis not present

## 2024-02-16 DIAGNOSIS — K625 Hemorrhage of anus and rectum: Secondary | ICD-10-CM

## 2024-02-16 DIAGNOSIS — K649 Unspecified hemorrhoids: Secondary | ICD-10-CM | POA: Diagnosis not present

## 2024-02-16 DIAGNOSIS — B1920 Unspecified viral hepatitis C without hepatic coma: Secondary | ICD-10-CM | POA: Diagnosis not present

## 2024-02-16 DIAGNOSIS — Z888 Allergy status to other drugs, medicaments and biological substances status: Secondary | ICD-10-CM

## 2024-02-16 LAB — COMPREHENSIVE METABOLIC PANEL WITH GFR
ALT: 105 U/L — ABNORMAL HIGH (ref 0–44)
AST: 176 U/L — ABNORMAL HIGH (ref 15–41)
Albumin: 1.7 g/dL — ABNORMAL LOW (ref 3.5–5.0)
Alkaline Phosphatase: 141 U/L — ABNORMAL HIGH (ref 38–126)
Anion gap: 9 (ref 5–15)
BUN: 25 mg/dL — ABNORMAL HIGH (ref 6–20)
CO2: 24 mmol/L (ref 22–32)
Calcium: 8.8 mg/dL — ABNORMAL LOW (ref 8.9–10.3)
Chloride: 103 mmol/L (ref 98–111)
Creatinine, Ser: 1.75 mg/dL — ABNORMAL HIGH (ref 0.61–1.24)
GFR, Estimated: 47 mL/min — ABNORMAL LOW (ref >60)
Glucose, Bld: 148 mg/dL — ABNORMAL HIGH (ref 70–99)
Potassium: 3.7 mmol/L (ref 3.5–5.1)
Sodium: 136 mmol/L (ref 135–145)
Total Bilirubin: 29.6 mg/dL (ref 0.0–1.2)
Total Protein: 6.9 g/dL (ref 6.5–8.1)

## 2024-02-16 LAB — ACETAMINOPHEN LEVEL: Acetaminophen (Tylenol), Serum: <10 ug/mL — ABNORMAL LOW (ref 10–30)

## 2024-02-16 LAB — CBC WITH DIFFERENTIAL/PLATELET
Abs Immature Granulocytes: 0.15 K/uL — ABNORMAL HIGH (ref 0.00–0.07)
Basophils Absolute: 0.1 K/uL (ref 0.0–0.1)
Basophils Relative: 1 %
Eosinophils Absolute: 0.1 K/uL (ref 0.0–0.5)
Eosinophils Relative: 1 %
HCT: 36.7 % — ABNORMAL LOW (ref 39.0–52.0)
Hemoglobin: 12.3 g/dL — ABNORMAL LOW (ref 13.0–17.0)
Immature Granulocytes: 2 %
Lymphocytes Relative: 15 %
Lymphs Abs: 1.4 K/uL (ref 0.7–4.0)
MCH: 29.3 pg (ref 26.0–34.0)
MCHC: 33.5 g/dL (ref 30.0–36.0)
MCV: 87.4 fL (ref 80.0–100.0)
Monocytes Absolute: 1.1 K/uL — ABNORMAL HIGH (ref 0.1–1.0)
Monocytes Relative: 12 %
Neutro Abs: 6.8 K/uL (ref 1.7–7.7)
Neutrophils Relative %: 69 %
Platelets: 237 K/uL (ref 150–400)
RBC: 4.2 MIL/uL — ABNORMAL LOW (ref 4.22–5.81)
RDW: 18.7 % — ABNORMAL HIGH (ref 11.5–15.5)
WBC: 9.7 K/uL (ref 4.0–10.5)
nRBC: 0 % (ref 0.0–0.2)

## 2024-02-16 LAB — HEPATIC FUNCTION PANEL
ALT: 113 U/L — ABNORMAL HIGH (ref 0–44)
AST: 198 U/L — ABNORMAL HIGH (ref 15–41)
Albumin: 1.8 g/dL — ABNORMAL LOW (ref 3.5–5.0)
Alkaline Phosphatase: 159 U/L — ABNORMAL HIGH (ref 38–126)
Bilirubin, Direct: 17.5 mg/dL — ABNORMAL HIGH (ref 0.0–0.2)
Indirect Bilirubin: 14.1 mg/dL
Total Bilirubin: 31.6 mg/dL (ref 0.0–1.2)
Total Protein: 7.1 g/dL (ref 6.5–8.1)

## 2024-02-16 LAB — PROTIME-INR
INR: 1.6 — ABNORMAL HIGH (ref 0.8–1.2)
Prothrombin Time: 19.4 s — ABNORMAL HIGH (ref 11.4–15.2)

## 2024-02-16 LAB — BILIRUBIN, DIRECT: Bilirubin, Direct: 18.1 mg/dL — ABNORMAL HIGH (ref 0.0–0.2)

## 2024-02-16 LAB — PARASITE EXAM SCREEN, BLOOD-W CONF TO LABCORP (NOT @ ARMC)

## 2024-02-16 LAB — LACTATE DEHYDROGENASE: LDH: 260 U/L — ABNORMAL HIGH (ref 98–192)

## 2024-02-16 LAB — HIV ANTIBODY (ROUTINE TESTING W REFLEX): HIV Screen 4th Generation wRfx: NONREACTIVE

## 2024-02-16 LAB — GAMMA GT: GGT: 86 U/L — ABNORMAL HIGH (ref 7–50)

## 2024-02-16 LAB — RAPID HIV SCREEN (HIV 1/2 AB+AG)
HIV 1/2 Antibodies: NONREACTIVE
HIV-1 P24 Antigen - HIV24: NONREACTIVE

## 2024-02-16 LAB — CK: Total CK: 197 U/L (ref 49–397)

## 2024-02-16 LAB — HEPATITIS PANEL, ACUTE
HCV Ab: NONREACTIVE
Hep A IgM: NONREACTIVE
Hep B C IgM: REACTIVE — AB
Hepatitis B Surface Ag: REACTIVE — AB

## 2024-02-16 LAB — TYPE AND SCREEN
ABO/RH(D): O POS
Antibody Screen: NEGATIVE

## 2024-02-16 LAB — TECHNOLOGIST SMEAR REVIEW
Clinical Information: ELEVATED
Plt Morphology: NORMAL

## 2024-02-16 LAB — AMMONIA: Ammonia: 88 umol/L — ABNORMAL HIGH (ref 9–35)

## 2024-02-16 LAB — HEMOGLOBIN A1C
Hgb A1c MFr Bld: 5.1 % (ref 4.8–5.6)
Mean Plasma Glucose: 99.67 mg/dL

## 2024-02-16 LAB — LIPASE, BLOOD: Lipase: 46 U/L (ref 11–51)

## 2024-02-16 LAB — TSH: TSH: 1.527 u[IU]/mL (ref 0.350–4.500)

## 2024-02-16 LAB — POC OCCULT BLOOD, ED: Fecal Occult Bld: POSITIVE — AB

## 2024-02-16 LAB — ABO/RH: ABO/RH(D): O POS

## 2024-02-16 MED ORDER — HYDRALAZINE HCL 20 MG/ML IJ SOLN: 5.0000 mg | INTRAMUSCULAR | Status: DC | PRN

## 2024-02-16 MED ORDER — VITAMIN K1 10 MG/ML IJ SOLN
10.0000 mg | Freq: Every day | INTRAVENOUS | Status: AC
  Administered 2024-02-16: 10 mg via INTRAVENOUS
  Filled 2024-02-16 (×2): qty 1

## 2024-02-16 MED ORDER — ACETAMINOPHEN 650 MG RE SUPP: 650.0000 mg | Freq: Four times a day (QID) | RECTAL | Status: DC | PRN

## 2024-02-16 MED ORDER — ARTESUNATE 110 MG IV SOLR (NON-CDC SUPPLY)
365.0000 mg | Freq: Two times a day (BID) | INTRAVENOUS | Status: DC
  Administered 2024-02-16 – 2024-02-17 (×2): 365 mg via INTRAVENOUS
  Filled 2024-02-16 (×3): qty 36.5

## 2024-02-16 MED ORDER — ONDANSETRON HCL 4 MG PO TABS: 4.0000 mg | ORAL_TABLET | Freq: Four times a day (QID) | ORAL | Status: DC | PRN

## 2024-02-16 MED ORDER — BOOST / RESOURCE BREEZE PO LIQD CUSTOM
1.0000 | Freq: Three times a day (TID) | ORAL | Status: DC
  Administered 2024-02-18 – 2024-02-21 (×9): 1 via ORAL

## 2024-02-16 MED ORDER — DIPHENHYDRAMINE HCL 25 MG PO CAPS
25.0000 mg | ORAL_CAPSULE | Freq: Once | ORAL | Status: AC | PRN
  Administered 2024-02-17: 25 mg via ORAL
  Filled 2024-02-16: qty 1

## 2024-02-16 MED ORDER — ACETAMINOPHEN 325 MG PO TABS: 650.0000 mg | ORAL_TABLET | Freq: Four times a day (QID) | ORAL | Status: DC | PRN

## 2024-02-16 MED ORDER — MORPHINE SULFATE (PF) 2 MG/ML IV SOLN
2.0000 mg | INTRAVENOUS | Status: DC | PRN
  Administered 2024-02-16 – 2024-02-17 (×2): 2 mg via INTRAVENOUS
  Filled 2024-02-16 (×2): qty 1

## 2024-02-16 MED ORDER — PANTOPRAZOLE SODIUM 40 MG IV SOLR
40.0000 mg | Freq: Two times a day (BID) | INTRAVENOUS | Status: DC
  Administered 2024-02-16 – 2024-02-21 (×11): 40 mg via INTRAVENOUS
  Filled 2024-02-16 (×11): qty 10

## 2024-02-16 MED ORDER — LACTATED RINGERS IV SOLN: INTRAVENOUS | Status: DC

## 2024-02-16 MED ORDER — ONDANSETRON HCL 4 MG/2ML IJ SOLN: 4.0000 mg | Freq: Four times a day (QID) | INTRAMUSCULAR | Status: DC | PRN

## 2024-02-16 NOTE — Consult Note (Addendum)
 [facility]    Date of Admission:  02/16/2024     Reason for Consult: possible malaria    Referring Provider: ED @ [facility]    Abx: artesunate         Assessment: chronic hep b, who just returned from [place] the past 24-48 hours brought in by his wife for fever, malaise for a several weeks, found to have malaria  Patient labs is rather concerning, while there is no pathognomonic labs with malaria, the degree of tbilirubin elevation is rather severe vs ast/alt and anemia Also has aki and the combination of labs do not quite reflect his nonseptic picture/severe falciparum infection   No fever at this time  He does have aki. Ultrasound liver showed cirrhosis and he has known hep b. I question if the labs are related to hep b   I also question heresay about his malaria treatment a month ago; not sure if this was supported by proper workup/history  He didn't take prophylaxis per his wife report     Plan: For now reasonable to do empiric malaria treatment Send malaria blood smear/[facility] referral testing; might need 3 smear if high suspicion and negative smear initially Would recommend to discuss case with gi regarding cirrhosis finding and renal about aki/cr elevation Would check hep b full panel and other hepatitis -- can't find hep d for some reason but can be checked outpatient Blood cultures Maintain standard isolation precaution Spoke with ED physicians Discussed with [facility] admitting team regarding gi consult   ---- Added malaria falciparium pcr and repeat smear. Initial smear negative. Not sure if this is even malaria   ------------------------------------------------ Principal Problem:   Jaundice Active Problems:   Gastroesophageal reflux disease without esophagitis   Primary hypertension   Morbid obesity with body mass index (BMI) of 40.0 to 44.9 in adult [facility])    HPI: William Gordon is a 48 y.o. male chronic hep b, who just  returned from [place] the past 24-48 hours brought in by his wife for fever, malaise for a several weeks, found to have malaria   He has been in [place] since beginning of this year. His wife stays home His wife reports he has had fever, chill, malaise 2 months and was recently treated in [place] for malaria a month ago vs something else (amoxicillin mentioned)  He continues to have malaise, fever/chill. There is report of ?blood stool  No chest pain/cough/sob No n/v/abd pain No joint pain No rash  Afebrile here No leukocytosis  Labs remarkable for: Tbilirubin 29; direct bilirubin 18; ast/alt 176/105; alkphos 141; cr 1.75; ldh 260 Cbc 9.7/12/237; slight elevation monocyte 1100; normal cellular morphology    History reviewed. No pertinent family history.  Social History   Tobacco Use   Smoking status: Never  Substance Use Topics   Alcohol use: No   Drug use: No    Allergies  Allergen Reactions   Lisinopril Other (See Comments)    Erectile dysfunction   Pork-Derived Products Other (See Comments)    Does not eat pork    Review of Systems: ROS All Other ROS was negative, except mentioned above   History reviewed. No pertinent past medical history.     Scheduled Meds:  artesunate   365 mg Intravenous Q12H   feeding supplement  1 Container Oral TID BM   pantoprazole  (PROTONIX ) IV  40 mg Intravenous Q12H   Continuous Infusions:  lactated ringers   PRN Meds:.acetaminophen  **OR** acetaminophen , hydrALAZINE , morphine  injection, ondansetron  **OR** ondansetron  (ZOFRAN ) IV   OBJECTIVE: Blood pressure 121/72, pulse 62, temperature 98.4 F (36.9 C), resp. rate 18, height 6' 4 (1.93 m), weight (!) 151.6 kg, SpO2 100%.  Physical Exam  General/constitutional: no distress, pleasant HEENT: Normocephalic, PER, Conj Clear, EOMI, Oropharynx clear Neck supple CV: rrr no mrg Lungs: clear to auscultation, normal respiratory effort Abd: Soft, Nontender Ext: no edema Skin:  No Rash Neuro: nonfocal MSK: no peripheral joint swelling/tenderness/warmth; back spines nontender    Lab Results Lab Results  Component Value Date   WBC 9.7 02/16/2024   HGB 12.3 (L) 02/16/2024   HCT 36.7 (L) 02/16/2024   MCV 87.4 02/16/2024   PLT 237 02/16/2024    Lab Results  Component Value Date   CREATININE 1.75 (H) 02/16/2024   BUN 25 (H) 02/16/2024   NA 136 02/16/2024   K 3.7 02/16/2024   CL 103 02/16/2024   CO2 24 02/16/2024    Lab Results  Component Value Date   ALT 105 (H) 02/16/2024   AST 176 (H) 02/16/2024   GGT 86 (H) 02/16/2024   ALKPHOS 141 (H) 02/16/2024   BILITOT 29.6 (HH) 02/16/2024      Microbiology: No results found for this or any previous visit (from the past 240 hours).   Serology:    Imaging: If present, new imagings (plain films, ct scans, and mri) have been personally visualized and interpreted; radiology reports have been reviewed. Decision making incorporated into the Impression / Recommendations.   02/16/24 cxr No acute cardiopulmonary process.   02/16/24 us  abd limited Liver appears shrunken with nodular contour suggesting cirrhosis.   Gallbladder is contracted without visible stones. Mild wall thickening could be related to contracted state or liver disease.   Constance ONEIDA Passer, MD [facility] [phone] pager    02/16/2024, 5:37 PM

## 2024-02-16 NOTE — ED Notes (Signed)
 IP unit notified of impending patient movement.

## 2024-02-16 NOTE — ED Triage Notes (Signed)
 Pt here from home with c/o abnormal liver enzymes and fatigue , pt recently dx and treated for malaria in [place]

## 2024-02-16 NOTE — ED Notes (Signed)
Labs collected by phlebotomy  

## 2024-02-16 NOTE — ED Notes (Signed)
 POC occult marked completed unintentionally. POC occult not yet collected.

## 2024-02-16 NOTE — ED Provider Notes (Incomplete)
 [facility] AT [facility] Provider Note   CSN: [id number] Arrival date & time: 02/16/24  9097     Patient presents with: Fatigue, Constipation, Emesis, Nausea, eyes yellow, Weakness, and Pruritis   Matix Bacigalupi is a 48 y.o. male.   48 year old male with a history of chronic hepatitis B who presents to the emergency department with generalized fatigue, fevers, bloody stools, and abnormal LFTs.  Patient reports that he was diagnosed with malaria 2 months ago when he was in [place].  Says that a month ago he started treatment for it with an unknown medication that he believes contained amoxicillin.  Says that he has been having frequent subjective fevers.  Also having generalized fatigue and mild generalized abdominal pain.  Also frequent nausea and vomiting.  Has had a few stools with blood in it which she attributes to hemorrhoids since having bowel movements is sometimes painful for him. Noticed that his eyes turned yellow as well.  Got back last night and came in to get checked out by hospital at home today.  Denies any alcohol use.  No IV drug use.  Took Tylenol  500 mg twice daily before this started but no heavy Tylenol  usage.  Not on any blood thinners.       Prior to Admission medications   Medication Sig Start Date End Date Taking? Authorizing Provider  ibuprofen (ADVIL) 200 MG tablet Take 200-400 mg by mouth every 6 (six) hours as needed (pain, headache).   Yes [provider]  OVER THE COUNTER MEDICATION Take 1 tablet by mouth daily. Tauroursodeoxycholic Acid (TUDCA), dosage unknown   Yes [provider]  VEMLIDY  25 MG tablet Take 25 mg by mouth daily. 01/11/23  Yes [provider]    Allergies: Lisinopril and Pork-derived products    Review of Systems  Updated Vital Signs BP 120/78 (BP Location: Right Arm)   Pulse 64   Temp 97.6 F (36.4 C) (Oral)   Resp 20   Ht 6' 4 (1.93 m)   Wt (!) 151.6 kg   SpO2 100%    BMI 40.68 kg/m   Physical Exam Vitals and nursing note reviewed.  Constitutional:      General: He is not in acute distress.    Appearance: He is well-developed.  HENT:     Head: Normocephalic and atraumatic.     Right Ear: External ear normal.     Left Ear: External ear normal.     Nose: Nose normal.  Eyes:     General: Scleral icterus present.     Extraocular Movements: Extraocular movements intact.     Conjunctiva/sclera: Conjunctivae normal.     Pupils: Pupils are equal, round, and reactive to light.  Cardiovascular:     Rate and Rhythm: Normal rate and regular rhythm.     Heart sounds: Normal heart sounds.  Pulmonary:     Effort: Pulmonary effort is normal. No respiratory distress.     Breath sounds: Normal breath sounds.  Abdominal:     General: There is no distension.     Palpations: Abdomen is soft. There is no mass.     Tenderness: There is abdominal tenderness (Mild, diffuse). There is no guarding.  Genitourinary:    Comments: Chaperoned by patient's RN Dylan. No external hemorrhoids or fissures noted on external inspection.  Internal hemorrhoids palpated. Normal rectal tone. Brown stool in rectal vault. No melena or gross blood.  Musculoskeletal:     Cervical back: Normal range of motion  and neck supple.     Right lower leg: Edema (1+) present.     Left lower leg: Edema (1+) present.  Skin:    General: Skin is warm and dry.  Neurological:     Mental Status: He is alert. Mental status is at baseline.  Psychiatric:        Mood and Affect: Mood normal.        Behavior: Behavior normal.     (all labs ordered are listed, but only abnormal results are displayed) Labs Reviewed  COMPREHENSIVE METABOLIC PANEL WITH GFR - Abnormal; Notable for the following components:      Result Value   Glucose, Bld 148 (*)    BUN 25 (*)    Creatinine, Ser 1.75 (*)    Calcium 8.8 (*)    Albumin 1.7 (*)    AST 176 (*)    ALT 105 (*)    Alkaline Phosphatase 141 (*)    Total  Bilirubin 29.6 (*)    GFR, Estimated 47 (*)    All other components within normal limits  CBC WITH DIFFERENTIAL/PLATELET - Abnormal; Notable for the following components:   RBC 4.20 (*)    Hemoglobin 12.3 (*)    HCT 36.7 (*)    RDW 18.7 (*)    Monocytes Absolute 1.1 (*)    Abs Immature Granulocytes 0.15 (*)    All other components within normal limits  PROTIME-INR - Abnormal; Notable for the following components:   Prothrombin Time 19.4 (*)    INR 1.6 (*)    All other components within normal limits  BILIRUBIN, DIRECT - Abnormal; Notable for the following components:   Bilirubin, Direct 18.1 (*)    All other components within normal limits  HEPATITIS PANEL, ACUTE - Abnormal; Notable for the following components:   Hepatitis B Surface Ag Reactive (*)    Hep B C IgM Reactive (*)    All other components within normal limits  LACTATE DEHYDROGENASE - Abnormal; Notable for the following components:   LDH 260 (*)    All other components within normal limits  GAMMA GT - Abnormal; Notable for the following components:   GGT 86 (*)    All other components within normal limits  HEPATIC FUNCTION PANEL - Abnormal; Notable for the following components:   Albumin 1.8 (*)    AST 198 (*)    ALT 113 (*)    Alkaline Phosphatase 159 (*)    Total Bilirubin 31.6 (*)    Bilirubin, Direct 17.5 (*)    All other components within normal limits  HEPATITIS B SURFACE ANTIGEN - Abnormal; Notable for the following components:   Hepatitis B Surface Ag Reactive (*)    All other components within normal limits  HEPATITIS A ANTIBODY, TOTAL - Abnormal; Notable for the following components:   hep A Total Ab Reactive (*)    All other components within normal limits  AMMONIA - Abnormal; Notable for the following components:   Ammonia 88 (*)    All other components within normal limits  ACETAMINOPHEN  LEVEL - Abnormal; Notable for the following components:   Acetaminophen  (Tylenol ), Serum <10 (*)    All other  components within normal limits  CBC WITH DIFFERENTIAL/PLATELET - Abnormal; Notable for the following components:   RBC 4.18 (*)    Hemoglobin 12.2 (*)    HCT 36.0 (*)    RDW 18.7 (*)    Abs Immature Granulocytes 0.13 (*)    All other components within normal limits  COMPREHENSIVE METABOLIC PANEL WITH GFR - Abnormal; Notable for the following components:   Glucose, Bld 131 (*)    BUN 23 (*)    Creatinine, Ser 1.53 (*)    Calcium 8.7 (*)    Albumin 1.6 (*)    AST 187 (*)    ALT 102 (*)    Alkaline Phosphatase 140 (*)    Total Bilirubin 27.3 (*)    GFR, Estimated 56 (*)    All other components within normal limits  PHOSPHORUS - Abnormal; Notable for the following components:   Phosphorus 5.9 (*)    All other components within normal limits  BILIRUBIN, DIRECT - Abnormal; Notable for the following components:   Bilirubin, Direct 15.8 (*)    All other components within normal limits  POC OCCULT BLOOD, ED - Abnormal; Notable for the following components:   Fecal Occult Bld POSITIVE (*)    All other components within normal limits  CULTURE, BLOOD (ROUTINE X 2)  CULTURE, BLOOD (ROUTINE X 2)  LIPASE, BLOOD  PARASITE EXAM SCREEN, BLOOD-W CONF TO [facility] (NOT @ [facility])  RAPID HIV SCREEN (HIV 1/2 AB+AG)  TECHNOLOGIST SMEAR REVIEW  CK  TSH  HEMOGLOBIN A1C  HIV ANTIBODY (ROUTINE TESTING W REFLEX)  MAGNESIUM  PARASITE EXAM SCREEN, BLOOD-W CONF TO [facility] (NOT @ [facility])  HAPTOGLOBIN  PARASITE EXAM, BLOOD  URINALYSIS, COMPLETE (UACMP) WITH MICROSCOPIC  HEPATITIS B CORE ANTIBODY, TOTAL  HEPATITIS B DNA, ULTRAQUANTITATIVE, PCR  HEPATITIS B E ANTIBODY  HEPATITIS B E ANTIGEN  HEPATITIS B SURFACE ANTIBODY, QUANTITATIVE  PLASMODIUM SP. PCR  ANA W/REFLEX IF POSITIVE  MITOCHONDRIAL ANTIBODIES  ANTI-SMOOTH MUSCLE ANTIBODY, IGG  PARASITE EXAM, BLOOD  TYPE AND SCREEN  ABO/RH  URINE CYTOLOGY ANCILLARY ONLY    EKG: EKG Interpretation Date/Time:  Thursday February 16 2024 09:16:31  EDT Ventricular Rate:  75 PR Interval:  186 QRS Duration:  100 QT Interval:  397 QTC Calculation: 444 R Axis:   -25  Text Interpretation: Sinus rhythm Borderline left axis deviation Low voltage, precordial leads Probable anteroseptal infarct, old Confirmed by Yolande Charleston [phone]) on 02/16/2024 9:46:38 AM  Radiology: US  Abdomen Limited RUQ (LIVER/GB) Result Date: 02/16/2024 CLINICAL DATA:  Bilirubinemia EXAM: ULTRASOUND ABDOMEN LIMITED RIGHT UPPER QUADRANT COMPARISON:  Ultrasound 05/22/2022.  MRI 06/06/2022 FINDINGS: Gallbladder: Gallbladder wall is thickened at 5 mm. Gallbladder is mildly contracted. No visible stones or sonographic Murphy sign. Common bile duct: Diameter: Normal caliber, 1 mm Liver: Shrunken liver with nodular contour suggesting cirrhosis. No focal hepatic abnormality. Portal vein is patent on color Doppler imaging with normal direction of blood flow towards the liver. Other: None. IMPRESSION: Liver appears shrunken with nodular contour suggesting cirrhosis. Gallbladder is contracted without visible stones. Mild wall thickening could be related to contracted state or liver disease. Electronically Signed   By: Franky Crease M.D.   On: 02/16/2024 12:19   DG Chest 2 View Result Date: 02/16/2024 CLINICAL DATA:  Fevers, recent return from less [place]. EXAM: CHEST - 2 VIEW COMPARISON:  01/31/2007 FINDINGS: Normal mediastinum and cardiac silhouette. Normal pulmonary vasculature. No evidence of effusion, infiltrate, or pneumothorax. No acute bony abnormality. IMPRESSION: No acute cardiopulmonary process. Electronically Signed   By: Jackquline Boxer M.D.   On: 02/16/2024 10:32     Procedures   Medications Ordered in the ED  artesunate  110 MG IV syringe 365 mg (365 mg Intravenous Given 02/17/24 0057)  pantoprazole  (PROTONIX ) injection 40 mg (40 mg Intravenous Given 02/16/24 2121)  lactated ringers  infusion ( Intravenous New  Bag/Given 02/17/24 0555)  acetaminophen  (TYLENOL ) tablet 650 mg  (has no administration in time range)    Or  acetaminophen  (TYLENOL ) suppository 650 mg (has no administration in time range)  morphine  (PF) 2 MG/ML injection 2 mg (2 mg Intravenous Given 02/17/24 0555)  ondansetron  (ZOFRAN ) tablet 4 mg (has no administration in time range)    Or  ondansetron  (ZOFRAN ) injection 4 mg (has no administration in time range)  hydrALAZINE  (APRESOLINE ) injection 5 mg (has no administration in time range)  feeding supplement (BOOST / RESOURCE BREEZE) liquid 1 Container (1 Container Oral Patient Refused/Not Given 02/16/24 2121)  phytonadione  (VITAMIN K ) 10 mg in dextrose 5 % 50 mL IVPB (10 mg Intravenous New Bag/Given 02/16/24 2121)  diphenhydrAMINE  (BENADRYL ) capsule 25 mg (25 mg Oral Given 02/17/24 0033)    Clinical Course as of 02/17/24 0613  Thu Feb 16, 2024  1142 Total Bilirubin(!!): 29.6 [RP]  1142 Alkaline Phosphatase(!): 141 [RP]  1142 ALT(!): 105 [RP]  1142 AST(!): 176 LFTs normal on 01/2023 lab work [RP]  1143 Creatinine(!): 1.75 Baseline of 1.4 based on lab work from 01/2023 [RP]  1143 Hemoglobin(!): 12.3 Baseline of 16 from lab work on 01/2023 [RP]  1146 Dw Dr  Constance Passer from infectious disease.  Recommends sending HIV and blood cultures.  Also recommends admission to the hospital.  They will see the patient shortly and give recommendations on medications. [RP]  1158 Dr Tobie from hospitalist consulted for admission [RP]    Clinical Course User Index [RP] Yolande Lamar BROCKS, MD                                 Medical Decision Making Amount and/or Complexity of Data Reviewed Labs: ordered. Decision-making details documented in ED Course. Radiology: ordered.  Risk Decision regarding hospitalization.   48 year old male with a history of chronic hepatitis B who presents to the emergency department from [place] with fevers, generalized fatigue, abdominal pain, nausea and vomiting, and scleral icterus  Initial Ddx:  Hepatitis, malaria, medication side  effect, cholestasis, cholangitis, choledocholithiasis  MDM/Course:  Patient presents to the emergency department with fevers generalized abdominal pain, nausea and vomiting, and scleral icterus in the setting of being diagnosed with malaria and taking antimalarial treatment.  On arrival he is not in acute distress.  Does appear jaundiced.  Has some minimal diffuse abdominal tenderness to palpation but no focal right upper quadrant tenderness.  Also is complaining of some blood in his stool so had a rectal exam that does show internal hemorrhoids.  No gross blood but was Hemoccult positive.  GI consult ordered but unfortunately did not hear back.  Infectious disease consulted and they will see the patient.  Admitted to hospitalist for further management.    This patient presents to the ED for concern of complaints listed in HPI, this involves an extensive number of treatment options, and is a complaint that carries with it a high risk of complications and morbidity. Disposition including potential need for admission considered.   Dispo: Admit to Floor  Additional history obtained from spouse Records reviewed Outpatient Clinic Notes The following labs were independently interpreted: Chemistry and show hyperbilirubinemia I independently reviewed the following imaging with scope of interpretation limited to determining acute life threatening conditions related to emergency care: Chest x-ray and agree with the radiologist interpretation with the following exceptions: none I personally reviewed and interpreted cardiac monitoring: normal sinus rhythm  I personally  reviewed and interpreted the pt's EKG: see above for interpretation  I have reviewed the patients home medications and made adjustments as needed Consults: Hospitalist and ID Social Determinants of health:  Recent trip to [place]  Portions of this note were generated with Scientist, clinical (histocompatibility and immunogenetics). Dictation errors may occur despite best  attempts at proofreading.     Final diagnoses:  Hyperbilirubinemia  Rectal bleeding  Nausea and vomiting, unspecified vomiting type  Fever, unspecified fever cause    ED Discharge Orders     None         Yolande Lamar BROCKS, MD 02/17/24 [phone]

## 2024-02-16 NOTE — ED Triage Notes (Signed)
 Pt. Stated, I just back from [place] last night and come straight here. I had Malaria and was treated but Im still having weakness, N/V, last normal BM was about a month ago . I see blood salso in my stool. My eyes seem more yellow. I have stomach pain too

## 2024-02-16 NOTE — H&P (Signed)
 History and Physical    Patient: William Gordon FMW:[id number] DOB: [birth year] DOA: 02/16/2024 DOS: the patient was seen and examined on 02/16/2024 . PCP: Patient, No Pcp Per  Patient coming from: Home Chief complaint: Chief Complaint  Patient presents with   Fatigue   Constipation   Emesis   Nausea   eyes yellow   Weakness   Pruritis  HPI:  Parminder Baty is a 48 y.o. male with past medical history  of  hep b, essential hypertension no meds in chart, obesity with a BMI of 40.68, GERD presenting today for multiple complaints from home for eyes becoming yellow itching generalized weakness fatigue constipation nausea and vomiting.  Patient is returning from [place] yesterday he was treated a month ago for malaria.  He completed his therapy and state has been having symptoms along with blood in his stools sometimes that he says has been due to his hemorrhoids along with complaints of perirectal pain.  Wife at bedside.  Denies any alcohol use.  States he has a history of hepatitis B but has not been treated.  ED Course:  Vital signs in the ED were notable for the following:  Vitals:   02/16/24 0916 02/16/24 1352 02/16/24 1507 02/16/24 1624  BP:   118/72 121/72  Pulse:   70 62  Temp:  98.1 F (36.7 C) 97.9 F (36.6 C) 98.4 F (36.9 C)  Resp:   (!) 23 18  Height: 6' 4 (1.93 m)     Weight: (!) 151.6 kg     SpO2:   99% 100%  TempSrc:  Oral Oral   BMI (Calculated): 40.7     >>ED evaluation thus far shows: CMP shows glucose 148 BUN of 25 AKI of creatinine 1.75 eGFR 47, abnormal LFTs with alk phos of 141 albumin 1.7 calcium 8.8 lipase 46 AST 176 ALT 105 direct bili of 18.1 total bili of 29.6. LDH of 260 upper limit of normal is 192. Peripheral smear requested and pending. GGT added on 86. CBC shows mild anemia with a hemoglobin of 12.3 white count of 9.7 normal platelets 237. INR of 1.6 and PT of 19.4. Fecal occult was positive. Urinalysis is ordered and pending. CPK is  normal. Chest x-ray is negative for any acute cardiopulmonary findings.   Liver appears cirrhotic and mild wall thickening on US  today.  >>While in the ED patient received the following: Medications  artesunate  110 MG IV syringe 365 mg (365 mg Intravenous Given 02/16/24 1330)  pantoprazole  (PROTONIX ) injection 40 mg (has no administration in time range)  lactated ringers  infusion (has no administration in time range)  acetaminophen  (TYLENOL ) tablet 650 mg (has no administration in time range)    Or  acetaminophen  (TYLENOL ) suppository 650 mg (has no administration in time range)  morphine  (PF) 2 MG/ML injection 2 mg (has no administration in time range)  ondansetron  (ZOFRAN ) tablet 4 mg (has no administration in time range)    Or  ondansetron  (ZOFRAN ) injection 4 mg (has no administration in time range)  hydrALAZINE  (APRESOLINE ) injection 5 mg (has no administration in time range)   Review of Systems  Constitutional:  Positive for malaise/fatigue.  Eyes:        Yellowish eyes.   Gastrointestinal:  Positive for blood in stool, nausea and vomiting.       Peri rectal pain   Skin:  Positive for itching.   History reviewed. No pertinent past medical history. History reviewed. No pertinent surgical history.  reports that he has  never smoked. He does not have any smokeless tobacco history on file. He reports that he does not drink alcohol and does not use drugs. Allergies  Allergen Reactions   Lisinopril Other (See Comments)    Erectile dysfunction   Pork-Derived Products Other (See Comments)    Does not eat pork   History reviewed. No pertinent family history. Prior to Admission medications   Medication Sig Start Date End Date Taking? Authorizing Provider  levofloxacin  (LEVAQUIN ) 500 MG tablet Take 1 tablet (500 mg total) by mouth daily. 12/14/15   Diedra Rocky PARAS, MD  naproxen  (NAPROSYN ) 500 MG tablet Take 1 tablet (500 mg total) by mouth 2 (two) times daily. 02/23/22   Enedelia Dorna HERO, FNP  Pramoxine-HC (HYDROCORTISONE  ACE-PRAMOXINE) 2.5-1 % CREA Apply to rectum twice a day for 1 week. 12/14/15   Diedra Rocky PARAS, MD                                                                                 Vitals:   02/16/24 9083 02/16/24 1352 02/16/24 1507 02/16/24 1624  BP:   118/72 121/72  Pulse:   70 62  Resp:   (!) 23 18  Temp:  98.1 F (36.7 C) 97.9 F (36.6 C) 98.4 F (36.9 C)  TempSrc:  Oral Oral   SpO2:   99% 100%  Weight: (!) 151.6 kg     Height: 6' 4 (1.93 m)      Physical Exam Vitals and nursing note reviewed.  Constitutional:      General: He is not in acute distress.    Appearance: He is not ill-appearing.  HENT:     Head: Normocephalic and atraumatic.     Right Ear: Hearing and external ear normal.     Left Ear: Hearing and external ear normal.     Nose: No nasal deformity.     Mouth/Throat:     Lips: Pink.  Eyes:     General: Lids are normal. Scleral icterus present.     Extraocular Movements: Extraocular movements intact.  Cardiovascular:     Rate and Rhythm: Normal rate and regular rhythm.     Pulses: Normal pulses.     Heart sounds: Normal heart sounds.  Pulmonary:     Effort: Pulmonary effort is normal.     Breath sounds: Normal breath sounds.  Abdominal:     General: Bowel sounds are normal. There is no distension.     Palpations: Abdomen is soft. There is no mass.     Tenderness: There is no abdominal tenderness.  Musculoskeletal:     Right lower leg: No edema.     Left lower leg: No edema.  Skin:    General: Skin is warm.  Neurological:     General: No focal deficit present.     Mental Status: He is alert and oriented to person, place, and time.     Cranial Nerves: Cranial nerves 2-12 are intact.  Psychiatric:        Speech: Speech normal.     Labs on Admission: I have personally reviewed following labs and imaging studies CBC: Recent Labs  Lab 02/16/24 0915  WBC 9.7  NEUTROABS  6.8  HGB 12.3*  HCT 36.7*  MCV  87.4  PLT 237   Basic Metabolic Panel: Recent Labs  Lab 02/16/24 0915  NA 136  K 3.7  CL 103  CO2 24  GLUCOSE 148*  BUN 25*  CREATININE 1.75*  CALCIUM 8.8*   GFR: Estimated Creatinine Clearance: 82.3 mL/min (A) (by C-G formula based on SCr of 1.75 mg/dL (H)). Liver Function Tests: Recent Labs  Lab 02/16/24 0915  AST 176*  ALT 105*  ALKPHOS 141*  BILITOT 29.6*  PROT 6.9  ALBUMIN 1.7*   Recent Labs  Lab 02/16/24 0915  LIPASE 46   No results for input(s): AMMONIA in the last 168 hours. Recent Labs    02/16/24 0915  BUN 25*  CREATININE 1.75*    Estimated Creatinine Clearance: 82.3 mL/min (A) (by C-G formula based on SCr of 1.75 mg/dL (H)).   Recent Labs    02/16/24 0915  BUN 25*  CREATININE 1.75*  CO2 24   Cardiac Enzymes: Recent Labs  Lab 02/16/24 0940  CKTOTAL 197   BNP (last 3 results) No results for input(s): PROBNP in the last 8760 hours. HbA1C: Recent Labs    02/16/24 0940  HGBA1C 5.1   CBG: No results for input(s): GLUCAP in the last 168 hours. Lipid Profile: No results for input(s): CHOL, HDL, LDLCALC, TRIG, CHOLHDL, LDLDIRECT in the last 72 hours. Thyroid Function Tests: No results for input(s): TSH, T4TOTAL, FREET4, T3FREE, THYROIDAB in the last 72 hours. Anemia Panel: No results for input(s): VITAMINB12, FOLATE, FERRITIN, TIBC, IRON, RETICCTPCT in the last 72 hours. Urine analysis:    Component Value Date/Time   LABSPEC 1.015 12/14/2015 1541   PHURINE 6.0 12/14/2015 1541   GLUCOSEU NEGATIVE 12/14/2015 1541   HGBUR NEGATIVE 12/14/2015 1541   BILIRUBINUR NEGATIVE 12/14/2015 1541   KETONESUR NEGATIVE 12/14/2015 1541   PROTEINUR 30 (A) 12/14/2015 1541   UROBILINOGEN 0.2 12/14/2015 1541   NITRITE NEGATIVE 12/14/2015 1541   LEUKOCYTESUR NEGATIVE 12/14/2015 1541   Radiological Exams on Admission: US  Abdomen Limited RUQ (LIVER/GB) Result Date: 02/16/2024 CLINICAL DATA:  Bilirubinemia EXAM:  ULTRASOUND ABDOMEN LIMITED RIGHT UPPER QUADRANT COMPARISON:  Ultrasound 05/22/2022.  MRI 06/06/2022 FINDINGS: Gallbladder: Gallbladder wall is thickened at 5 mm. Gallbladder is mildly contracted. No visible stones or sonographic Murphy sign. Common bile duct: Diameter: Normal caliber, 1 mm Liver: Shrunken liver with nodular contour suggesting cirrhosis. No focal hepatic abnormality. Portal vein is patent on color Doppler imaging with normal direction of blood flow towards the liver. Other: None. IMPRESSION: Liver appears shrunken with nodular contour suggesting cirrhosis. Gallbladder is contracted without visible stones. Mild wall thickening could be related to contracted state or liver disease. Electronically Signed   By: Franky Crease M.D.   On: 02/16/2024 12:19   DG Chest 2 View Result Date: 02/16/2024 CLINICAL DATA:  Fevers, recent return from less [place]. EXAM: CHEST - 2 VIEW COMPARISON:  01/31/2007 FINDINGS: Normal mediastinum and cardiac silhouette. Normal pulmonary vasculature. No evidence of effusion, infiltrate, or pneumothorax. No acute bony abnormality. IMPRESSION: No acute cardiopulmonary process. Electronically Signed   By: Jackquline Boxer M.D.   On: 02/16/2024 10:32   Data Reviewed: Relevant notes from primary care and specialist visits, past discharge summaries as available in EHR, including Care Everywhere . Prior diagnostic testing as pertinent to current admission diagnoses, Updated medications and problem lists for reconciliation .ED course, including vitals, labs, imaging, treatment and response to treatment,Triage notes, nursing and pharmacy notes and ED provider's notes.Notable results as  noted in HPI.Discussed case with EDMD/ ED APP/ or Specialty MD on call and as needed.  Assessment & Plan  >>Generalized weakness: Suspect 2/2 to his acute illness d/d include viral hepatitis, electrolyte abnormality.   >>Jaundice/ abnormal LFT/ pruritus/ fatigue/ Hep b : D/d include Hep b and  related cirrhosis, concerns of hemolysis peripheral smear normal morphology, appreciate GI consult. Requested GI consult.Hepatitis serology and STD check ordered.   >>AKI: Lab Results  Component Value Date   CREATININE 1.75 (H) 02/16/2024   CREATININE 1.3 01/31/2007  Monitor avoid contrast and renally dose needed medications. Nephrology consult if creatinine continues to worsen or stays unresolved.   >>GIB/ ABLA: Patient also presenting with rectal bleeding off-and-on. Discussed with GI we will start patient on IV PPIs-type and screen, clear liquid diet and n.p.o. after midnight.   DVT prophylaxis:  SCD's  Consults:  ID.  GI.   Advance Care Planning:    Code Status: Full Code   Family Communication:  Wife at bedside.  Disposition Plan:  Home.  Severity of Illness: The appropriate patient status for this patient is INPATIENT. Inpatient status is judged to be reasonable and necessary in order to provide the required intensity of service to ensure the patient's safety. The patient's presenting symptoms, physical exam findings, and initial radiographic and laboratory data in the context of their chronic comorbidities is felt to place them at high risk for further clinical deterioration. Furthermore, it is not anticipated that the patient will be medically stable for discharge from the hospital within 2 midnights of admission.   * I certify that at the point of admission it is my clinical judgment that the patient will require inpatient hospital care spanning beyond 2 midnights from the point of admission due to high intensity of service, high risk for further deterioration and high frequency of surveillance required.*  Unresulted Labs (From admission, onward)     Start     Ordered   02/16/24 1818  Acetaminophen  level  Once,   R        02/16/24 1817   02/16/24 1817  Ammonia  Add-on,   AD        02/16/24 1816   02/16/24 1815  Hepatitis A antibody, total  Add-on,   AD         02/16/24 1815   02/16/24 1811  Plasmodium sp. PCR  Once,   R        02/16/24 1810   02/16/24 1811  Parasite Exam Screen, Blood-w conf to [facility] (Not @ [facility])  Once,   R        02/16/24 1810   02/16/24 1806  Hepatitis B core antibody, total  Once,   R        02/16/24 1810   02/16/24 1806  Hepatitis B DNA, ultraquantitative, PCR  Once,   R        02/16/24 1810   02/16/24 1806  Hepatitis B e antibody  Once,   R        02/16/24 1810   02/16/24 1806  Hepatitis B e antigen  Once,   R        02/16/24 1810   02/16/24 1806  Hepatitis B surface antibody,quantitative  Once,   R        02/16/24 1810   02/16/24 1806  Hepatitis B surface antigen  Once,   R        02/16/24 1810   02/16/24 1406  HIV Antibody (routine testing w rflx)  (  HIV Antibody (Routine testing w reflex) panel)  Once,   R        02/16/24 1405   02/16/24 1346  Hepatic function panel  Add-on,   AD        02/16/24 1345   02/16/24 1345  TSH  Once,   R        02/16/24 1345   02/16/24 1220  Urinalysis, Complete w Microscopic -Urine, Random  Add-on,   AD       Question:  Specimen Source  Answer:  Urine, Random   02/16/24 1219   02/16/24 1148  Blood culture (routine x 2)  BLOOD CULTURE X 2,   R (with STAT occurrences)      02/16/24 1147   02/16/24 1148  Rapid HIV screen (HIV 1/2 Ab+Ag)  Once,   URGENT        02/16/24 1147   02/16/24 0917  Haptoglobin  Once,   URGENT        02/16/24 0916   02/16/24 0915  Parasite Exam, Blood  Once,   R        02/16/24 0915            Meds ordered this encounter  Medications   artesunate  110 MG IV syringe 365 mg   pantoprazole  (PROTONIX ) injection 40 mg   lactated ringers  infusion   OR Linked Order Group    acetaminophen  (TYLENOL ) tablet 650 mg    acetaminophen  (TYLENOL ) suppository 650 mg   morphine  (PF) 2 MG/ML injection 2 mg   OR Linked Order Group    ondansetron  (ZOFRAN ) tablet 4 mg    ondansetron  (ZOFRAN ) injection 4 mg   hydrALAZINE  (APRESOLINE ) injection 5 mg   feeding  supplement (BOOST / RESOURCE BREEZE) liquid 1 Container     Orders Placed This Encounter  Procedures   Blood culture (routine x 2)   DG Chest 2 View   US  Abdomen Limited RUQ (LIVER/GB)   Comprehensive metabolic panel   Lipase, blood   CBC with Diff   Protime-INR   Parasite Exam Screen, Blood-w conf to [facility] (Not @ [facility])   Bilirubin, direct   Hepatitis panel, acute   Lactate dehydrogenase   Haptoglobin   Parasite Exam, Blood   Rapid HIV screen (HIV 1/2 Ab+Ag)   Technologist smear review   Gamma GT   Urinalysis, Complete w Microscopic -Urine, Random   CK   TSH   Hepatic function panel   Hemoglobin A1c   HIV Antibody (routine testing w rflx)   Hepatitis B core antibody, total   Hepatitis B DNA, ultraquantitative, PCR   Hepatitis B e antibody   Hepatitis B e antigen   Hepatitis B surface antibody,quantitative   Hepatitis B surface antigen   Plasmodium sp. PCR   Parasite Exam Screen, Blood-w conf to [facility] (Not @ [facility])   Hepatitis A antibody, total   Ammonia   Acetaminophen  level   Diet clear liquid Room service appropriate? Yes; Fluid consistency: Thin   Initiate Carrier Fluid Protocol   SCDs   Cardiac Monitoring Continuous x 24 hours Indications for use: Other; other indications for use: Monitor for ischemia   Vital signs   Notify physician (specify)   Mobility Protocol: No Restrictions   Refer to Sidebar Report Mobility Protocol for Adult Inpatient   Initiate Adult Central Line Maintenance and Catheter Protocol for patients with central line (CVC, PICC, Port, Hemodialysis, Trialysis)   If patient diabetic or glucose greater than 140 notify physician for Sliding Scale  Insulin Orders   Intake and Output   Initiate CHG Protocol   Do not place and if present remove PureWick   Initiate Oral Care Protocol   Initiate Carrier Fluid Protocol   RN may order General Admission PRN Orders utilizing General Admission PRN medications (through manage orders) for the following  patient needs: allergy symptoms (Claritin), cold sores (Carmex), cough (Robitussin DM), eye irritation (Liquifilm Tears), hemorrhoids (Tucks), indigestion (Maalox), minor skin irritation (Hydrocortisone  Cream), muscle pain Lucienne Gay), nose irritation (saline nasal spray) and sore throat (Chloraseptic spray).   Full code   Consult to infectious diseases   Consult for [facility] Admission   Consult to gastroenterology  Malaria elevated LFTs   Pulse oximetry check with vital signs   Oxygen therapy Mode or (Route): Nasal cannula; Liters Per Minute: 2; Keep O2 saturation between: greater than 92 %   POC occult blood, ED   EKG 12-Lead   Type and screen   ABO/Rh   Insert peripheral IV   Admit to Inpatient (patient's expected length of stay will be greater than 2 midnights or inpatient only procedure)   Aspiration precautions   Fall precautions    Author: Mario LULLA Blanch, MD 12 pm- 8 pm. [facility]. 02/16/2024 6:21 PM Please note for any communication after hours contact [facility] Assigned provider on call on Amion.

## 2024-02-16 NOTE — ED Notes (Addendum)
Pt off unit to imaging.

## 2024-02-17 ENCOUNTER — Other Ambulatory Visit (HOSPITAL_COMMUNITY): Payer: Self-pay

## 2024-02-17 DIAGNOSIS — R17 Unspecified jaundice: Secondary | ICD-10-CM | POA: Diagnosis not present

## 2024-02-17 LAB — COMPREHENSIVE METABOLIC PANEL WITH GFR
ALT: 102 U/L — ABNORMAL HIGH (ref 0–44)
AST: 187 U/L — ABNORMAL HIGH (ref 15–41)
Albumin: 1.6 g/dL — ABNORMAL LOW (ref 3.5–5.0)
Alkaline Phosphatase: 140 U/L — ABNORMAL HIGH (ref 38–126)
Anion gap: 8 (ref 5–15)
BUN: 23 mg/dL — ABNORMAL HIGH (ref 6–20)
CO2: 24 mmol/L (ref 22–32)
Calcium: 8.7 mg/dL — ABNORMAL LOW (ref 8.9–10.3)
Chloride: 103 mmol/L (ref 98–111)
Creatinine, Ser: 1.53 mg/dL — ABNORMAL HIGH (ref 0.61–1.24)
GFR, Estimated: 56 mL/min — ABNORMAL LOW (ref >60)
Glucose, Bld: 131 mg/dL — ABNORMAL HIGH (ref 70–99)
Potassium: 3.5 mmol/L (ref 3.5–5.1)
Sodium: 135 mmol/L (ref 135–145)
Total Bilirubin: 27.3 mg/dL (ref 0.0–1.2)
Total Protein: 6.7 g/dL (ref 6.5–8.1)

## 2024-02-17 LAB — CBC WITH DIFFERENTIAL/PLATELET
Abs Immature Granulocytes: 0.13 K/uL — ABNORMAL HIGH (ref 0.00–0.07)
Basophils Absolute: 0.1 K/uL (ref 0.0–0.1)
Basophils Relative: 1 %
Eosinophils Absolute: 0.1 K/uL (ref 0.0–0.5)
Eosinophils Relative: 1 %
HCT: 36 % — ABNORMAL LOW (ref 39.0–52.0)
Hemoglobin: 12.2 g/dL — ABNORMAL LOW (ref 13.0–17.0)
Immature Granulocytes: 2 %
Lymphocytes Relative: 16 %
Lymphs Abs: 1.4 K/uL (ref 0.7–4.0)
MCH: 29.2 pg (ref 26.0–34.0)
MCHC: 33.9 g/dL (ref 30.0–36.0)
MCV: 86.1 fL (ref 80.0–100.0)
Monocytes Absolute: 0.6 K/uL (ref 0.1–1.0)
Monocytes Relative: 6 %
Neutro Abs: 6.7 K/uL (ref 1.7–7.7)
Neutrophils Relative %: 74 %
Platelets: 223 K/uL (ref 150–400)
RBC: 4.18 MIL/uL — ABNORMAL LOW (ref 4.22–5.81)
RDW: 18.7 % — ABNORMAL HIGH (ref 11.5–15.5)
WBC: 8.9 K/uL (ref 4.0–10.5)
nRBC: 0 % (ref 0.0–0.2)

## 2024-02-17 LAB — HEPATITIS B SURFACE ANTIGEN: Hepatitis B Surface Ag: REACTIVE — AB

## 2024-02-17 LAB — HAPTOGLOBIN: Haptoglobin: <10 mg/dL — ABNORMAL LOW (ref 23–355)

## 2024-02-17 LAB — HEPATITIS A ANTIBODY, TOTAL: hep A Total Ab: REACTIVE — AB

## 2024-02-17 LAB — PARASITE EXAM SCREEN, BLOOD-W CONF TO LABCORP (NOT @ ARMC)

## 2024-02-17 LAB — PHOSPHORUS: Phosphorus: 5.9 mg/dL — ABNORMAL HIGH (ref 2.5–4.6)

## 2024-02-17 LAB — BILIRUBIN, DIRECT: Bilirubin, Direct: 15.8 mg/dL — ABNORMAL HIGH (ref 0.0–0.2)

## 2024-02-17 LAB — MAGNESIUM: Magnesium: 2 mg/dL (ref 1.7–2.4)

## 2024-02-17 MED ORDER — TENOFOVIR ALAFENAMIDE FUMARATE 25 MG PO TABS
25.0000 mg | ORAL_TABLET | Freq: Every day | ORAL | Status: DC
  Filled 2024-02-17: qty 1

## 2024-02-17 MED ORDER — DIPHENHYDRAMINE HCL 25 MG PO CAPS
25.0000 mg | ORAL_CAPSULE | Freq: Four times a day (QID) | ORAL | Status: DC | PRN
  Administered 2024-02-17 – 2024-02-21 (×9): 25 mg via ORAL
  Filled 2024-02-17 (×9): qty 1

## 2024-02-17 MED ORDER — ENTECAVIR 0.5 MG PO TABS
1.0000 mg | ORAL_TABLET | ORAL | Status: DC
  Filled 2024-02-17: qty 2

## 2024-02-17 MED ORDER — ENTECAVIR 0.5 MG PO TABS
1.0000 mg | ORAL_TABLET | Freq: Every day | ORAL | Status: DC
  Administered 2024-02-17 – 2024-02-21 (×5): 1 mg via ORAL
  Filled 2024-02-17 (×5): qty 2

## 2024-02-17 MED ORDER — LACTULOSE 10 GM/15ML PO SOLN
20.0000 g | Freq: Two times a day (BID) | ORAL | Status: DC
  Administered 2024-02-17 – 2024-02-21 (×7): 20 g via ORAL
  Filled 2024-02-17 (×8): qty 30

## 2024-02-17 MED ORDER — HYDROCORT-PRAMOXINE (PERIANAL) 1-1 % EX FOAM
1.0000 | Freq: Two times a day (BID) | CUTANEOUS | Status: DC
  Administered 2024-02-17 – 2024-02-21 (×8): 1 via RECTAL
  Filled 2024-02-17 (×2): qty 10

## 2024-02-17 MED ORDER — OXYCODONE HCL 5 MG PO TABS
5.0000 mg | ORAL_TABLET | ORAL | Status: DC | PRN
  Administered 2024-02-17 – 2024-02-21 (×8): 5 mg via ORAL
  Filled 2024-02-17 (×8): qty 1

## 2024-02-17 NOTE — Progress Notes (Signed)
 [facility]  Date of Admission:  02/16/2024     Abx: artesunate                                                            Assessment: chronic hep b, who just returned from [place] the past 24-48 hours brought in by his wife for fever, malaise for a several weeks, found to have malaria   Patient labs is rather concerning, while there is no pathognomonic labs with malaria, the degree of tbilirubin elevation is rather severe vs ast/alt and anemia Also has aki and the combination of labs do not quite reflect his nonseptic picture/severe falciparum infection     No fever at this time   He does have aki. Ultrasound liver showed cirrhosis and he has known hep b. 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 He was on tenofovir  and stopped 10/2023 when he lost 2 bottles in [place]. Entecavir  is better with his insurance situation  GI evaluating       Plan: Start entecavir  1 mg daily for decompensated cirrhosis Appreciate gi management Given decompensated cirrhosis, would feel more comfortable if he could follow gi outpatient Maintain standard isolation precaution Stop malaria treatment Will sign off Discussed with primary team Send malaria blood smear/[facility] referral testing; might need 3 smear if high suspicion and negative smear initially Would recommend to discuss case with gi regarding  cirrhosis finding and renal about aki/cr elevation Would check hep b full panel and other hepatitis -- can't find hep d for some reason but can be checked outpatient Blood cultures Maintain standard isolation precaution Spoke with ED physicians Discussed with [facility] admitting team regarding gi consult    ---- Added malaria falciparium pcr and repeat smear. Initial smear negative. Not sure if this is even malaria  Principal Problem:   Jaundice Active Problems:   Gastroesophageal reflux disease without esophagitis   Primary hypertension   Morbid obesity with body mass index (BMI) of 40.0 to 44.9 in adult [facility])   Hepatitis C virus infection without hepatic coma   Allergies  Allergen Reactions   Lisinopril Other (See Comments)    Erectile dysfunction   Pork-Derived Products Other (See Comments)    Does not eat pork    Scheduled Meds:  entecavir   1 mg Oral Daily   feeding supplement  1 Container Oral TID BM   lactulose   20 g Oral BID   pantoprazole  (PROTONIX ) IV  40 mg Intravenous Q12H   Continuous Infusions:  lactated ringers  100 mL/hr at 02/17/24 0555   phytonadione  (VITAMIN K ) 10 mg in dextrose 5 % 50 mL IVPB 10 mg (02/16/24 2121)   PRN Meds:.acetaminophen  **  OR** acetaminophen , hydrALAZINE , morphine  injection, ondansetron  **OR** ondansetron  (ZOFRAN ) IV   SUBJECTIVE: Feels the same, chronic progressive malaise, jaudice No itch No dyspnea No abd pain No joint pain No rash  Gi to see  Review of Systems: ROS All other ROS was negative, except mentioned above     OBJECTIVE: Vitals:   02/16/24 2357 02/17/24 0343 02/17/24 0736 02/17/24 1145  BP: 113/71 120/78 120/69 119/74  Pulse: 66 64 68 71  Resp: 20 20 18 18   Temp: 97.8 F (36.6 C) 97.6 F (36.4 C) 97.6 F (36.4 C) 98.4 F (36.9 C)  TempSrc: Oral Oral    SpO2: 100% 100% 100% 100%  Weight:      Height:       Body mass index is 40.68 kg/m.  Physical Exam  General/constitutional: scleral icterus;  well developed Neck supple CV: rrr no mrg Lungs: clear to auscultation, normal respiratory effort Abd: Soft, Nontender Ext: no edema Skin: palmar jaundice Neuro: nonfocal MSK: no peripheral joint swelling/tenderness/warmth; back spines nontender    Lab Results Lab Results  Component Value Date   WBC 8.9 02/17/2024   HGB 12.2 (L) 02/17/2024   HCT 36.0 (L) 02/17/2024   MCV 86.1 02/17/2024   PLT 223 02/17/2024    Lab Results  Component Value Date   CREATININE 1.53 (H) 02/17/2024   BUN 23 (H) 02/17/2024   NA 135 02/17/2024   K 3.5 02/17/2024   CL 103 02/17/2024   CO2 24 02/17/2024    Lab Results  Component Value Date   ALT 102 (H) 02/17/2024   AST 187 (H) 02/17/2024   GGT 86 (H) 02/16/2024   ALKPHOS 140 (H) 02/17/2024   BILITOT 27.3 (HH) 02/17/2024      Microbiology: Recent Results (from the past 240 hours)  Blood culture (routine x 2)     Status: None (Preliminary result)   Collection Time: 02/16/24 12:40 PM   Specimen: BLOOD  Result Value Ref Range Status   Specimen Description BLOOD SITE NOT SPECIFIED  Final   Special Requests   Final    BOTTLES DRAWN AEROBIC AND ANAEROBIC Blood Culture adequate volume   Culture   Final    NO GROWTH < 24 HOURS Performed at [facility], [address] [place], [place] [address]    Report Status PENDING  Incomplete  Blood culture (routine x 2)     Status: None (Preliminary result)   Collection Time: 02/16/24  4:57 PM   Specimen: BLOOD LEFT ARM  Result Value Ref Range Status   Specimen Description BLOOD LEFT ARM  Final   Special Requests   Final    BOTTLES DRAWN AEROBIC ONLY Blood Culture results may not be optimal due to an inadequate volume of blood received in culture bottles   Culture   Final    NO GROWTH < 12 HOURS Performed at [facility], [address] [place], [place] [address]    Report Status PENDING  Incomplete     Serology:   Imaging: If present, new imagings (plain films, ct scans, and  mri) have been personally visualized and interpreted; radiology reports have been reviewed. Decision making incorporated into the Impression / Recommendations.  9/4 us  abd limited Liver appears shrunken with nodular contour suggesting cirrhosis.   Gallbladder is contracted without visible stones. Mild wall thickening could be related to contracted state or liver disease.  Constance ONEIDA Passer, MD [facility] [phone] pager  02/17/2024, 2:46 PM

## 2024-02-17 NOTE — Progress Notes (Signed)
-  2221- Lab called updated RN of lab results T. Bili 31.6 MD Dr Alfornia notified no new orders at this time patient to be seen bt Gastroenterology in the am.   2224- Telemetry called and notified RN of Rhythm change. RN assessed patient obtained VS and notified MD Dr. Bernetta of acute change. New orders for stat EKG. RN follow up with MD per MD request after completion of EKG no new orders.  MD Dr Alfornia notified per patient request of itching new order for Benadryl  prn.  MD Dr Alfornia notified of new lab results blood smear negative for parasites no new orders at this time.

## 2024-02-17 NOTE — Progress Notes (Signed)
 [facility] night cross cover note:   Prn po benadryl  added for pruritus.      Eva Pore, DO Hospitalist

## 2024-02-17 NOTE — Consult Note (Signed)
 Referring Provider: Dr. Perri Primary Care Physician:  Patient, No Pcp Per Primary Gastroenterologist:  Dr. Saintclair  Reason for Consultation:  Jaundice; Hepatitis B  HPI: William Gordon is a 48 y.o. male with chronic hepatitis B who developed yellow eyes 3-4 weeks ago along with dark-colored urine, and severe itching for several weeks after being in [place] for 6 months. He has been on Tenofovir  followed by Dr. Saintclair who he last saw in 2023. Reports compliance with his Tenofovir  until several months ago when he ran out while in [place]. He did not think he would be there as long as he did (6 months) and ran out of the Tenofovir  over 3 months ago. He also was treated for presumed malaria with other meds for several months. Denies fevers/chills/abdominal pain. Has been having rectal pain and rectal bleeding that he attributes to hemorrhoids. Took Tylenol  500 mg BID. Denies alcohol. His wife brought him to the hospital last night He has not had a colonoscopy although it was recommended to him in 2023 but he never had it done.   History reviewed. No pertinent past medical history.  History reviewed. No pertinent surgical history.  Prior to Admission medications   Medication Sig Start Date End Date Taking? Authorizing Provider  ibuprofen (ADVIL) 200 MG tablet Take 200-400 mg by mouth every 6 (six) hours as needed (pain, headache).   Yes [provider]  OVER THE COUNTER MEDICATION Take 1 tablet by mouth daily. Tauroursodeoxycholic Acid (TUDCA), dosage unknown   Yes [provider]  VEMLIDY  25 MG tablet Take 25 mg by mouth daily. 01/11/23  Yes [provider]    Scheduled Meds:  entecavir   1 mg Oral Daily   feeding supplement  1 Container Oral TID BM   lactulose   20 g Oral BID   pantoprazole  (PROTONIX ) IV  40 mg Intravenous Q12H   Continuous Infusions:  lactated ringers  100 mL/hr at 02/17/24 0555   phytonadione  (VITAMIN K ) 10 mg in dextrose 5 % 50 mL IVPB 10 mg (02/16/24  2121)   PRN Meds:.acetaminophen  **OR** acetaminophen , hydrALAZINE , morphine  injection, ondansetron  **OR** ondansetron  (ZOFRAN ) IV  Allergies as of 02/16/2024 - Review Complete 02/16/2024  Allergen Reaction Noted   Lisinopril Other (See Comments) 02/16/2024   Pork-derived products Other (See Comments) 02/16/2024    History reviewed. No pertinent family history.  Social History   Socioeconomic History   Marital status: Married    Spouse name: Not on file   Number of children: Not on file   Years of education: Not on file   Highest education level: Not on file  Occupational History   Not on file  Tobacco Use   Smoking status: Never   Smokeless tobacco: Not on file  Substance and Sexual Activity   Alcohol use: No   Drug use: No   Sexual activity: Not on file  Other Topics Concern   Not on file  Social History Narrative   Not on file   Social Drivers of Health   Financial Resource Strain: Not on file  Food Insecurity: No Food Insecurity (02/16/2024)   Hunger Vital Sign    Worried About Running Out of Food in the Last Year: Never true    Ran Out of Food in the Last Year: Never true  Transportation Needs: No Transportation Needs (02/16/2024)   PRAPARE - Administrator, Civil Service (Medical): No    Lack of Transportation (Non-Medical): No  Physical Activity: Not on file  Stress: Not on  file  Social Connections: Not on file  Intimate Partner Violence: Not At Risk (02/16/2024)   Humiliation, Afraid, Rape, and Kick questionnaire    Fear of Current or Ex-Partner: No    Emotionally Abused: No    Physically Abused: No    Sexually Abused: No    Review of Systems: All negative except as stated above in HPI.  Physical Exam: Vital signs: Vitals:   02/17/24 0736 02/17/24 1145  BP: 120/69 119/74  Pulse: 68 71  Resp: 18 18  Temp: 97.6 F (36.4 C) 98.4 F (36.9 C)  SpO2: 100% 100%   Last BM Date : 02/16/24 General:  Lethargic, jaundice, Well-developed,  well-nourished, pleasant and cooperative in NAD Head: normocephalic, atraumatic Eyes: +icteric sclera ENT: oropharynx clear Neck: supple, nontender Lungs:  Clear throughout to auscultation.   No wheezes, crackles, or rhonchi. No acute distress. Heart:  Regular rate and rhythm; no murmurs, clicks, rubs,  or gallops. Abdomen: soft, nontender, nondistended, +BS  Rectal:  Deferred Ext: no edema Neuro: no asterixis  GI:  Lab Results: Recent Labs    02/16/24 0915 02/17/24 0145  WBC 9.7 8.9  HGB 12.3* 12.2*  HCT 36.7* 36.0*  PLT 237 223   BMET Recent Labs    02/16/24 0915 02/17/24 0145  NA 136 135  K 3.7 3.5  CL 103 103  CO2 24 24  GLUCOSE 148* 131*  BUN 25* 23*  CREATININE 1.75* 1.53*  CALCIUM 8.8* 8.7*   LFT Recent Labs    02/16/24 1406 02/17/24 0145  PROT 7.1 6.7  ALBUMIN 1.8* 1.6*  AST 198* 187*  ALT 113* 102*  ALKPHOS 159* 140*  BILITOT 31.6* 27.3*  BILIDIR 17.5* 15.8*  IBILI 14.1  --    PT/INR Recent Labs    02/16/24 0915  LABPROT 19.4*  INR 1.6*     Studies/Results: US  Abdomen Limited RUQ (LIVER/GB) Result Date: 02/16/2024 CLINICAL DATA:  Bilirubinemia EXAM: ULTRASOUND ABDOMEN LIMITED RIGHT UPPER QUADRANT COMPARISON:  Ultrasound 05/22/2022.  MRI 06/06/2022 FINDINGS: Gallbladder: Gallbladder wall is thickened at 5 mm. Gallbladder is mildly contracted. No visible stones or sonographic Murphy sign. Common bile duct: Diameter: Normal caliber, 1 mm Liver: Shrunken liver with nodular contour suggesting cirrhosis. No focal hepatic abnormality. Portal vein is patent on color Doppler imaging with normal direction of blood flow towards the liver. Other: None. IMPRESSION: Liver appears shrunken with nodular contour suggesting cirrhosis. Gallbladder is contracted without visible stones. Mild wall thickening could be related to contracted state or liver disease. Electronically Signed   By: Franky Crease M.D.   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 HepB core Ab IgM positive. HepBe and HBV DNA serologies pending. Entecavir  started by ID and due to outpt costs of Tenofovir  will keep on Entecavir . Needs updated CT this hospitalization with IV contrast if serum Cr improves. Will hold off on ordering CT for now. No signs of encephalopathy or liver failure to warrant transplant to liver transplant center at this time. Supportive care. Start rectal cream for hemorrhoids. Dr. Rosalie on call this weekend if additional questions arise otherwise our team will sign off.    LOS: 1 day   William Gordon  02/17/2024, 2:17 PM  Questions please call [phone]

## 2024-02-17 NOTE — Progress Notes (Addendum)
 PROGRESS NOTE    William Gordon  FMW:[id number] DOB: 09-12-1975 DOA: 02/16/2024 PCP: Patient, No Pcp Per  Chief Complaint  Patient presents with   Fatigue   Constipation   Emesis   Nausea   eyes yellow   Weakness   Pruritis    Brief Narrative:   48 yo male with hx chronic hepatitis B, hypertension, obesity presenting with jaundice, pruritus, generalized weakness, fatigue, nausa/and vomiting.  Recently returned from [place] on the day of admission.  Reportedly treated for malaria there.    He's been admitted with jaundice, abnormal lfts.   Assessment & Plan:   Principal Problem:   Jaundice Active Problems:   Gastroesophageal reflux disease without esophagitis   Primary hypertension   Morbid obesity with body mass index (BMI) of 40.0 to 44.9 in adult [facility])   Hepatitis C virus infection without hepatic coma  Decompensated Cirrhosis Chronic Hepatitis B Bilirubin 27.3 (direct 15.8), AST 187, ALT 102, INR 1.6 (9/4), platelets 223, albumin 1.6 Appreciate ID and GI assistance Will continue entecavir  Hepatitis B core ab, hep B dna, hep B e ab, hep B e ag, hepatitis B surface ab, hepatitis B surface ag (reactive).  Hep Jaqueline Uber total Ab reactive.  Hepatitis D IgG/IgM ([facility] sendout).  Hepatitis D virus RNA quantitative PCR test ([facility] sendout).   ASMA, AMA, ANA - pending Negative HIV Blood cultures pending Lactulose  (no encephalopathy, no asterixis at this time despite elevated ammonia) Consider repeat CT scan with contrast pending renal function Ok to keep here given no signs of liver failure or encephalopathy to warrant transfer to transplant center  Haptoglobin is <10, LDH elevated Technologist smear without notable findings Will discuss with heme - ? If low haptoglobin can all be explained by liver issues above (vs hemolysis)  AKI Creatinine at presentation 1.75, will trend   Anemia Noted, trend  Hemorrhoids Topical management Pain meds as needed  Pending plasmodium sp  PCR Pending parasite exam blood Parasite exam screen without plasmodium or other blood parasites on thin smears    DVT prophylaxis: SCD Code Status: full Family Communication: none Disposition:   Status is: Inpatient Remains inpatient appropriate because: need for additional inpatient care   Consultants:  GI ID  Procedures:  none  Antimicrobials:  Anti-infectives (From admission, onward)    Start     Dose/Rate Route Frequency Ordered Stop   02/17/24 1130  entecavir  (BARACLUDE ) tablet 1 mg        1 mg Oral Daily 02/17/24 1032     02/17/24 1115  entecavir  (BARACLUDE ) tablet 1 mg  Status:  Discontinued        1 mg Oral Every 24 hours 02/17/24 1016 02/17/24 1032   02/17/24 1100  entecavir  (BARACLUDE ) tablet 1 mg  Status:  Discontinued        1 mg Oral Every 24 hours 02/17/24 1006 02/17/24 1007   02/17/24 1100  tenofovir  alafenamide (VEMLIDY ) tablet 25 mg  Status:  Discontinued        25 mg Oral Daily 02/17/24 1007 02/17/24 1016   02/16/24 1200  artesunate  110 MG IV syringe 365 mg  Status:  Discontinued        365 mg Intravenous Every 12 hours 02/16/24 1156 02/17/24 1006       Subjective: C/o pain related to hemorrhoids  Objective: Vitals:   02/17/24 0343 02/17/24 0736 02/17/24 1145 02/17/24 1700  BP: 120/78 120/69 119/74 131/74  Pulse: 64 68 71 76  Resp: 20 18 18 18   Temp: 97.6  F (36.4 C) 97.6 F (36.4 C) 98.4 F (36.9 C) 99 F (37.2 C)  TempSrc: Oral   Oral  SpO2: 100% 100% 100% 100%  Weight:      Height:        Intake/Output Summary (Last 24 hours) at 02/17/2024 1742 Last data filed at 02/16/2024 2144 Gross per 24 hour  Intake 200 ml  Output --  Net 200 ml   Filed Weights   02/16/24 0916  Weight: (!) 151.6 kg    Examination:  General: uncomfortable appearing, laying on his side Icteric sclera  Cardiovascular: RRR Lungs: unlabored Abdomen: Soft, nontender, nondistended  Neurological: Alert and oriented 3. Moves all extremities 4 with equal  strength. Cranial nerves II through XII grossly intact.. Extremities: No clubbing or cyanosis. No edema.   Data Reviewed: I have personally reviewed following labs and imaging studies  CBC: Recent Labs  Lab 02/16/24 0915 02/17/24 0145  WBC 9.7 8.9  NEUTROABS 6.8 6.7  HGB 12.3* 12.2*  HCT 36.7* 36.0*  MCV 87.4 86.1  PLT 237 223    Basic Metabolic Panel: Recent Labs  Lab 02/16/24 0915 02/17/24 0145  NA 136 135  K 3.7 3.5  CL 103 103  CO2 24 24  GLUCOSE 148* 131*  BUN 25* 23*  CREATININE 1.75* 1.53*  CALCIUM 8.8* 8.7*  MG  --  2.0  PHOS  --  5.9*    GFR: Estimated Creatinine Clearance: 94.1 mL/min (Jhaniya Briski) (by C-G formula based on SCr of 1.53 mg/dL (H)).  Liver Function Tests: Recent Labs  Lab 02/16/24 0915 02/16/24 1406 02/17/24 0145  AST 176* 198* 187*  ALT 105* 113* 102*  ALKPHOS 141* 159* 140*  BILITOT 29.6* 31.6* 27.3*  PROT 6.9 7.1 6.7  ALBUMIN 1.7* 1.8* 1.6*    CBG: No results for input(s): GLUCAP in the last 168 hours.   Recent Results (from the past 240 hours)  Blood culture (routine x 2)     Status: None (Preliminary result)   Collection Time: 02/16/24 12:40 PM   Specimen: BLOOD  Result Value Ref Range Status   Specimen Description BLOOD SITE NOT SPECIFIED  Final   Special Requests   Final    BOTTLES DRAWN AEROBIC AND ANAEROBIC Blood Culture adequate volume   Culture   Final    NO GROWTH < 24 HOURS Performed at [facility], [address], [place], [place] [address]    Report Status PENDING  Incomplete  Blood culture (routine x 2)     Status: None (Preliminary result)   Collection Time: 02/16/24  4:57 PM   Specimen: BLOOD LEFT ARM  Result Value Ref Range Status   Specimen Description BLOOD LEFT ARM  Final   Special Requests   Final    BOTTLES DRAWN AEROBIC ONLY Blood Culture results may not be optimal due to an inadequate volume of blood received in culture bottles   Culture   Final    NO GROWTH < 12 HOURS Performed at [facility], [address], [place], [place] [address]    Report Status PENDING  Incomplete         Radiology Studies: US  Abdomen Limited RUQ (LIVER/GB) Result Date: 02/16/2024 CLINICAL DATA:  Bilirubinemia EXAM: ULTRASOUND ABDOMEN LIMITED RIGHT UPPER QUADRANT COMPARISON:  Ultrasound 05/22/2022.  MRI 06/06/2022 FINDINGS: Gallbladder: Gallbladder wall is thickened at 5 mm. Gallbladder is mildly contracted. No visible stones or sonographic Murphy sign. Common bile duct: Diameter: Normal caliber, 1 mm Liver: Shrunken liver  with nodular contour suggesting cirrhosis. No focal hepatic abnormality. Portal vein is patent on color Doppler imaging with normal direction of blood flow towards the liver. Other: None. IMPRESSION: Liver appears shrunken with nodular contour suggesting cirrhosis. Gallbladder is contracted without visible stones. Mild wall thickening could be related to contracted state or liver disease. Electronically Signed   By: Franky Crease M.D.   On: 02/16/2024 12:19   DG Chest 2 View Result Date: 02/16/2024 CLINICAL DATA:  Fevers, recent return from less [place]. EXAM: CHEST - 2 VIEW COMPARISON:  01/31/2007 FINDINGS: Normal mediastinum and cardiac silhouette. Normal pulmonary vasculature. No evidence of effusion, infiltrate, or pneumothorax. No acute bony abnormality. IMPRESSION: No acute cardiopulmonary process. Electronically Signed   By: Jackquline Boxer M.D.   On: 02/16/2024 10:32        Scheduled Meds:  entecavir   1 mg Oral Daily   feeding supplement  1 Container Oral TID BM   hydrocortisone -pramoxine  1 applicator Rectal BID   lactulose   20 g Oral BID   pantoprazole  (PROTONIX ) IV  40 mg Intravenous Q12H   Continuous Infusions:  lactated ringers  100 mL/hr at 02/17/24 1720   phytonadione  (VITAMIN K ) 10 mg in dextrose 5 % 50 mL IVPB 10 mg (02/16/24 2121)     LOS: 1 day    Time spent: over 30 min     Meliton Monte, MD [facility]   To contact the attending  provider between 7A-7P or the covering provider during after hours 7P-7A, please log into the web site [email or web address] and access using universal [place] password for that web site. If you do not have the password, please call the hospital operator.  02/17/2024, 5:42 PM

## 2024-02-17 NOTE — Plan of Care (Signed)

## 2024-02-18 DIAGNOSIS — R17 Unspecified jaundice: Secondary | ICD-10-CM | POA: Diagnosis not present

## 2024-02-18 LAB — IRON AND TIBC
Iron: 231 ug/dL — ABNORMAL HIGH (ref 45–182)
Saturation Ratios: 151 % — ABNORMAL HIGH (ref 17.9–39.5)
TIBC: 153 ug/dL — ABNORMAL LOW (ref 250–450)

## 2024-02-18 LAB — COMPREHENSIVE METABOLIC PANEL WITH GFR
ALT: 110 U/L — ABNORMAL HIGH (ref 0–44)
AST: 250 U/L — ABNORMAL HIGH (ref 15–41)
Albumin: 1.6 g/dL — ABNORMAL LOW (ref 3.5–5.0)
Alkaline Phosphatase: 150 U/L — ABNORMAL HIGH (ref 38–126)
Anion gap: 9 (ref 5–15)
BUN: 17 mg/dL (ref 6–20)
CO2: 24 mmol/L (ref 22–32)
Calcium: 8.7 mg/dL — ABNORMAL LOW (ref 8.9–10.3)
Chloride: 104 mmol/L (ref 98–111)
Creatinine, Ser: 1.35 mg/dL — ABNORMAL HIGH (ref 0.61–1.24)
GFR, Estimated: >60 mL/min (ref >60)
Glucose, Bld: 81 mg/dL (ref 70–99)
Potassium: 3.6 mmol/L (ref 3.5–5.1)
Sodium: 137 mmol/L (ref 135–145)
Total Bilirubin: 24.5 mg/dL (ref 0.0–1.2)
Total Protein: 6.7 g/dL (ref 6.5–8.1)

## 2024-02-18 LAB — MAGNESIUM: Magnesium: 2 mg/dL (ref 1.7–2.4)

## 2024-02-18 LAB — FOLATE: Folate: 6.8 ng/mL (ref >5.9)

## 2024-02-18 LAB — URINALYSIS, COMPLETE (UACMP) WITH MICROSCOPIC
Glucose, UA: NEGATIVE mg/dL
Hgb urine dipstick: NEGATIVE
Ketones, ur: NEGATIVE mg/dL
Leukocytes,Ua: NEGATIVE
Nitrite: NEGATIVE
Protein, ur: 30 mg/dL — AB
Specific Gravity, Urine: 1.018 (ref 1.005–1.030)
pH: 6 (ref 5.0–8.0)

## 2024-02-18 LAB — CBC WITH DIFFERENTIAL/PLATELET
Abs Immature Granulocytes: 0.05 K/uL (ref 0.00–0.07)
Basophils Absolute: 0 K/uL (ref 0.0–0.1)
Basophils Relative: 1 %
Eosinophils Absolute: 0.2 K/uL (ref 0.0–0.5)
Eosinophils Relative: 3 %
HCT: 36.2 % — ABNORMAL LOW (ref 39.0–52.0)
Hemoglobin: 12.2 g/dL — ABNORMAL LOW (ref 13.0–17.0)
Immature Granulocytes: 1 %
Lymphocytes Relative: 15 %
Lymphs Abs: 0.8 K/uL (ref 0.7–4.0)
MCH: 29.1 pg (ref 26.0–34.0)
MCHC: 33.7 g/dL (ref 30.0–36.0)
MCV: 86.4 fL (ref 80.0–100.0)
Monocytes Absolute: 0.4 K/uL (ref 0.1–1.0)
Monocytes Relative: 7 %
Neutro Abs: 4 K/uL (ref 1.7–7.7)
Neutrophils Relative %: 73 %
Platelets: 212 K/uL (ref 150–400)
RBC: 4.19 MIL/uL — ABNORMAL LOW (ref 4.22–5.81)
RDW: 18.8 % — ABNORMAL HIGH (ref 11.5–15.5)
WBC: 5.4 K/uL (ref 4.0–10.5)
nRBC: 0.6 % — ABNORMAL HIGH (ref 0.0–0.2)

## 2024-02-18 LAB — HEPATITIS B DNA, ULTRAQUANTITATIVE, PCR
HBV DNA SERPL PCR-ACNC: 1620000 [IU]/mL
HBV DNA SERPL PCR-LOG IU: 6.21 {Log_IU}/mL

## 2024-02-18 LAB — HEPATITIS B CORE ANTIBODY, TOTAL: HEP B CORE AB: POSITIVE — AB

## 2024-02-18 LAB — FERRITIN: Ferritin: >7500 ng/mL — ABNORMAL HIGH (ref 24–336)

## 2024-02-18 LAB — RETICULOCYTES
Immature Retic Fract: 4.6 % (ref 2.3–15.9)
RBC.: 4.11 MIL/uL — ABNORMAL LOW (ref 4.22–5.81)
Retic Count, Absolute: 58 K/uL (ref 19.0–186.0)
Retic Ct Pct: 1.4 % (ref 0.4–3.1)

## 2024-02-18 LAB — PROTIME-INR
INR: 1.5 — ABNORMAL HIGH (ref 0.8–1.2)
Prothrombin Time: 19.3 s — ABNORMAL HIGH (ref 11.4–15.2)

## 2024-02-18 LAB — PHOSPHORUS: Phosphorus: 4.5 mg/dL (ref 2.5–4.6)

## 2024-02-18 LAB — HEPATITIS B E ANTIGEN: Hep B E Ag: POSITIVE — AB

## 2024-02-18 LAB — HEPATITIS B SURFACE ANTIBODY, QUANTITATIVE: Hep B S AB Quant (Post): <3.5 m[IU]/mL — ABNORMAL LOW

## 2024-02-18 LAB — HEPATITIS B E ANTIBODY: Hep B E Ab: NONREACTIVE

## 2024-02-18 NOTE — Progress Notes (Signed)
 PROGRESS NOTE    William Gordon  FMW:[id number] DOB: 06/29/1975 DOA: 02/16/2024 PCP: Patient, No Pcp Per  Chief Complaint  Patient presents with   Fatigue   Constipation   Emesis   Nausea   eyes yellow   Weakness   Pruritis    Brief Narrative:   48 yo male with hx chronic hepatitis B, hypertension, obesity presenting with jaundice, pruritus, generalized weakness, fatigue, nausa/and vomiting.  Recently returned from [place] on the day of admission.  Reportedly treated for malaria there.    He's been admitted with jaundice, abnormal lfts.   Assessment & Plan:   Principal Problem:   Jaundice Active Problems:   Gastroesophageal reflux disease without esophagitis   Primary hypertension   Morbid obesity with body mass index (BMI) of 40.0 to 44.9 in adult [facility])   Hepatitis C virus infection without hepatic coma  Decompensated Cirrhosis Chronic Hepatitis B Bilirubin 24.5,, AST 250, ALT 110, INR 1.5 (9/4), platelets 212, albumin 1.6 Appreciate ID and GI assistance Will continue entecavir  Hepatitis B core ab positive, hep B dna, hep B e ab nonreactive, hep B e ag postive, hepatitis B surface ab, hepatitis B surface ag (reactive).  Hep Chaunice Obie total Ab reactive.  Hepatitis D IgG/IgM ([facility] sendout).  Hepatitis D virus RNA quantitative PCR test ([facility] sendout).   ASMA, AMA, ANA - pending Negative HIV Blood cultures pending Lactulose  (no encephalopathy, no asterixis at this time despite elevated ammonia) Consider repeat CT scan with contrast pending renal function Ok to keep here given no signs of liver failure or encephalopathy to warrant transfer to transplant center  Haptoglobin is <10, LDH elevated Technologist smear without notable findings Will discuss with heme - I suspect low haptoglobin due to liver disease  AKI improving  Anemia Noted, trend  Hemorrhoids Topical management Pain meds as needed  Pending plasmodium sp PCR Pending parasite exam blood Parasite exam  screen without plasmodium or other blood parasites on thin smears    DVT prophylaxis: SCD Code Status: full Family Communication: none Disposition:   Status is: Inpatient Remains inpatient appropriate because: need for additional inpatient care   Consultants:  GI ID  Procedures:  none  Antimicrobials:  Anti-infectives (From admission, onward)    Start     Dose/Rate Route Frequency Ordered Stop   02/17/24 1130  entecavir  (BARACLUDE ) tablet 1 mg        1 mg Oral Daily 02/17/24 1032     02/17/24 1115  entecavir  (BARACLUDE ) tablet 1 mg  Status:  Discontinued        1 mg Oral Every 24 hours 02/17/24 1016 02/17/24 1032   02/17/24 1100  entecavir  (BARACLUDE ) tablet 1 mg  Status:  Discontinued        1 mg Oral Every 24 hours 02/17/24 1006 02/17/24 1007   02/17/24 1100  tenofovir  alafenamide (VEMLIDY ) tablet 25 mg  Status:  Discontinued        25 mg Oral Daily 02/17/24 1007 02/17/24 1016   02/16/24 1200  artesunate  110 MG IV syringe 365 mg  Status:  Discontinued        365 mg Intravenous Every 12 hours 02/16/24 1156 02/17/24 1006       Subjective: Feels Hannalee Castor little better today  Objective: Vitals:   02/17/24 1932 02/18/24 0004 02/18/24 0405 02/18/24 0809  BP: 120/79 120/81 116/75 125/77  Pulse: 74 65 63 73  Resp: 18 17 17 18   Temp: 98.2 F (36.8 C) 98.3 F (36.8 C) 98.3 F (36.8 C)  97.9 F (36.6 C)  TempSrc:      SpO2: 100% 99% 100% 99%  Weight:      Height:       No intake or output data in the 24 hours ending 02/18/24 0918  Filed Weights   02/16/24 0916  Weight: (!) 151.6 kg    Examination:  General: No acute distress. Scleral icterus Cardiovascular: RRR Lungs: unlabored Abdomen: Soft, nontender, nondistended Neurological: Alert and oriented 3. Moves all extremities 4 with equal strength. Cranial nerves II through XII grossly intact. Extremities: No clubbing or cyanosis. No edema.   Data Reviewed: I have personally reviewed following labs and imaging  studies  CBC: Recent Labs  Lab 02/16/24 0915 02/17/24 0145 02/18/24 0719  WBC 9.7 8.9 5.4  NEUTROABS 6.8 6.7 4.0  HGB 12.3* 12.2* 12.2*  HCT 36.7* 36.0* 36.2*  MCV 87.4 86.1 86.4  PLT 237 223 212    Basic Metabolic Panel: Recent Labs  Lab 02/16/24 0915 02/17/24 0145 02/18/24 0719  NA 136 135 137  K 3.7 3.5 3.6  CL 103 103 104  CO2 24 24 24   GLUCOSE 148* 131* 81  BUN 25* 23* 17  CREATININE 1.75* 1.53* 1.35*  CALCIUM 8.8* 8.7* 8.7*  MG  --  2.0 2.0  PHOS  --  5.9* 4.5    GFR: Estimated Creatinine Clearance: 106.7 mL/min (Carmina Walle) (by C-G formula based on SCr of 1.35 mg/dL (H)).  Liver Function Tests: Recent Labs  Lab 02/16/24 0915 02/16/24 1406 02/17/24 0145 02/18/24 0719  AST 176* 198* 187* 250*  ALT 105* 113* 102* 110*  ALKPHOS 141* 159* 140* 150*  BILITOT 29.6* 31.6* 27.3* 24.5*  PROT 6.9 7.1 6.7 6.7  ALBUMIN 1.7* 1.8* 1.6* 1.6*    CBG: No results for input(s): GLUCAP in the last 168 hours.   Recent Results (from the past 240 hours)  Blood culture (routine x 2)     Status: None (Preliminary result)   Collection Time: 02/16/24 12:40 PM   Specimen: BLOOD  Result Value Ref Range Status   Specimen Description BLOOD SITE NOT SPECIFIED  Final   Special Requests   Final    BOTTLES DRAWN AEROBIC AND ANAEROBIC Blood Culture adequate volume   Culture   Final    NO GROWTH < 24 HOURS Performed at [facility], [address], [place], [place] [address]    Report Status PENDING  Incomplete  Blood culture (routine x 2)     Status: None (Preliminary result)   Collection Time: 02/16/24  4:57 PM   Specimen: BLOOD LEFT ARM  Result Value Ref Range Status   Specimen Description BLOOD LEFT ARM  Final   Special Requests   Final    BOTTLES DRAWN AEROBIC ONLY Blood Culture results may not be optimal due to an inadequate volume of blood received in culture bottles   Culture   Final    NO GROWTH < 12 HOURS Performed at [facility], [address]  [place], [place] [address]    Report Status PENDING  Incomplete         Radiology Studies: US  Abdomen Limited RUQ (LIVER/GB) Result Date: 02/16/2024 CLINICAL DATA:  Bilirubinemia EXAM: ULTRASOUND ABDOMEN LIMITED RIGHT UPPER QUADRANT COMPARISON:  Ultrasound 05/22/2022.  MRI 06/06/2022 FINDINGS: Gallbladder: Gallbladder wall is thickened at 5 mm. Gallbladder is mildly contracted. No visible stones or sonographic Murphy sign. Common bile duct: Diameter: Normal caliber, 1 mm Liver: Shrunken liver with nodular contour suggesting cirrhosis. No focal  hepatic abnormality. Portal vein is patent on color Doppler imaging with normal direction of blood flow towards the liver. Other: None. IMPRESSION: Liver appears shrunken with nodular contour suggesting cirrhosis. Gallbladder is contracted without visible stones. Mild wall thickening could be related to contracted state or liver disease. Electronically Signed   By: Franky Crease M.D.   On: 02/16/2024 12:19   DG Chest 2 View Result Date: 02/16/2024 CLINICAL DATA:  Fevers, recent return from less [place]. EXAM: CHEST - 2 VIEW COMPARISON:  01/31/2007 FINDINGS: Normal mediastinum and cardiac silhouette. Normal pulmonary vasculature. No evidence of effusion, infiltrate, or pneumothorax. No acute bony abnormality. IMPRESSION: No acute cardiopulmonary process. Electronically Signed   By: Jackquline Boxer M.D.   On: 02/16/2024 10:32        Scheduled Meds:  entecavir   1 mg Oral Daily   feeding supplement  1 Container Oral TID BM   hydrocortisone -pramoxine  1 applicator Rectal BID   lactulose   20 g Oral BID   pantoprazole  (PROTONIX ) IV  40 mg Intravenous Q12H   Continuous Infusions:  lactated ringers  100 mL/hr at 02/17/24 1720   phytonadione  (VITAMIN K ) 10 mg in dextrose 5 % 50 mL IVPB 10 mg (02/16/24 2121)     LOS: 2 days    Time spent: over 30 min     Meliton Monte, MD [facility]   To contact the attending provider between 7A-7P or the  covering provider during after hours 7P-7A, please log into the web site [email or web address] and access using universal [place] password for that web site. If you do not have the password, please call the hospital operator.  02/18/2024, 9:18 AM

## 2024-02-18 NOTE — Plan of Care (Signed)

## 2024-02-18 NOTE — Plan of Care (Signed)

## 2024-02-19 DIAGNOSIS — R17 Unspecified jaundice: Secondary | ICD-10-CM | POA: Diagnosis not present

## 2024-02-19 LAB — CBC WITH DIFFERENTIAL/PLATELET
Abs Immature Granulocytes: 0.03 K/uL (ref 0.00–0.07)
Basophils Absolute: 0.1 K/uL (ref 0.0–0.1)
Basophils Relative: 1 %
Eosinophils Absolute: 0.2 K/uL (ref 0.0–0.5)
Eosinophils Relative: 4 %
HCT: 36.6 % — ABNORMAL LOW (ref 39.0–52.0)
Hemoglobin: 12.5 g/dL — ABNORMAL LOW (ref 13.0–17.0)
Immature Granulocytes: 1 %
Lymphocytes Relative: 33 %
Lymphs Abs: 1.9 K/uL (ref 0.7–4.0)
MCH: 29.1 pg (ref 26.0–34.0)
MCHC: 34.2 g/dL (ref 30.0–36.0)
MCV: 85.3 fL (ref 80.0–100.0)
Monocytes Absolute: 0.7 K/uL (ref 0.1–1.0)
Monocytes Relative: 12 %
Neutro Abs: 2.8 K/uL (ref 1.7–7.7)
Neutrophils Relative %: 49 %
Platelets: 210 K/uL (ref 150–400)
RBC: 4.29 MIL/uL (ref 4.22–5.81)
RDW: 18.5 % — ABNORMAL HIGH (ref 11.5–15.5)
WBC: 5.6 K/uL (ref 4.0–10.5)
nRBC: 0 % (ref 0.0–0.2)

## 2024-02-19 LAB — COMPREHENSIVE METABOLIC PANEL WITH GFR
ALT: 127 U/L — ABNORMAL HIGH (ref 0–44)
AST: 266 U/L — ABNORMAL HIGH (ref 15–41)
Albumin: 1.7 g/dL — ABNORMAL LOW (ref 3.5–5.0)
Alkaline Phosphatase: 142 U/L — ABNORMAL HIGH (ref 38–126)
Anion gap: 10 (ref 5–15)
BUN: 16 mg/dL (ref 6–20)
CO2: 21 mmol/L — ABNORMAL LOW (ref 22–32)
Calcium: 8.9 mg/dL (ref 8.9–10.3)
Chloride: 104 mmol/L (ref 98–111)
Creatinine, Ser: 1.2 mg/dL (ref 0.61–1.24)
GFR, Estimated: >60 mL/min (ref >60)
Glucose, Bld: 80 mg/dL (ref 70–99)
Potassium: 3.7 mmol/L (ref 3.5–5.1)
Sodium: 135 mmol/L (ref 135–145)
Total Bilirubin: 24.4 mg/dL (ref 0.0–1.2)
Total Protein: 7.3 g/dL (ref 6.5–8.1)

## 2024-02-19 LAB — MAGNESIUM: Magnesium: 2 mg/dL (ref 1.7–2.4)

## 2024-02-19 LAB — PROTIME-INR
INR: 1.4 — ABNORMAL HIGH (ref 0.8–1.2)
Prothrombin Time: 17.8 s — ABNORMAL HIGH (ref 11.4–15.2)

## 2024-02-19 LAB — ANTI-SMOOTH MUSCLE ANTIBODY, IGG: F-Actin IgG: 15 U (ref 0–19)

## 2024-02-19 LAB — PHOSPHORUS: Phosphorus: 4.4 mg/dL (ref 2.5–4.6)

## 2024-02-19 LAB — MITOCHONDRIAL ANTIBODIES: Mitochondrial M2 Ab, IgG: <20 U (ref 0.0–20.0)

## 2024-02-19 LAB — VITAMIN B12: Vitamin B-12: 3137 pg/mL — ABNORMAL HIGH (ref 180–914)

## 2024-02-19 NOTE — Plan of Care (Signed)
   Problem: Education: Goal: Knowledge of General Education information will improve Description Including pain rating scale, medication(s)/side effects and non-pharmacologic comfort measures Outcome: Progressing   Problem: Health Behavior/Discharge Planning: Goal: Ability to manage health-related needs will improve Outcome: Progressing

## 2024-02-19 NOTE — Progress Notes (Signed)
 PROGRESS NOTE    William Gordon  FMW:[id number] DOB: [birth year] DOA: 02/16/2024 PCP: Patient, No Pcp Per  Chief Complaint  Patient presents with   Fatigue   Constipation   Emesis   Nausea   eyes yellow   Weakness   Pruritis    Brief Narrative:   48 yo male with hx chronic hepatitis B, hypertension, obesity presenting with jaundice, pruritus, generalized weakness, fatigue, nausa/and vomiting.  Recently returned from [place] on the day of admission.  Reportedly treated for malaria there.    He's been admitted with jaundice, abnormal lfts.   Assessment & Plan:   Principal Problem:   Jaundice Active Problems:   Gastroesophageal reflux disease without esophagitis   Primary hypertension   Morbid obesity with body mass index (BMI) of 40.0 to 44.9 in adult [facility])   Hepatitis C virus infection without hepatic coma  Decompensated Cirrhosis Chronic Hepatitis B Bilirubin 24.4,, AST 266, ALT 127, INR 1.4 (9/4), platelets 210, albumin 1.7 Appreciate ID and GI assistance Will continue entecavir  Hepatitis B core ab positive, hep B dna 1,620,000, hep B e ab nonreactive, hep B e ag postive, hepatitis B surface ab <3.5, hepatitis B surface ag (reactive).  Hep [facility] total Ab reactive.  Hepatitis D IgG/IgM ([facility] sendout).  Hepatitis D virus RNA quantitative PCR test ([facility] sendout).   ASMA, AMA, ANA - pending Negative HIV Blood cultures pending Lactulose  (no encephalopathy, no asterixis at this time despite elevated ammonia) Consider repeat CT scan with contrast pending renal function Ok to keep here given no signs of liver failure or encephalopathy to warrant transfer to transplant center  Elevated Ferritin  In the setting of above  Consider additional w/u as indicated  Haptoglobin is <10, LDH elevated Technologist smear without notable findings I suspect low haptoglobin due to liver disease  AKI improving  Anemia Noted, trend  Hemorrhoids Topical management Pain meds as  needed  Pending plasmodium sp PCR Pending parasite exam blood Parasite exam screen without plasmodium or other blood parasites on thin smears    DVT prophylaxis: SCD Code Status: full Family Communication: wife at bedside Disposition:   Status is: Inpatient Remains inpatient appropriate because: need for additional inpatient care   Consultants:  GI ID  Procedures:  none  Antimicrobials:  Anti-infectives (From admission, onward)    Start     Dose/Rate Route Frequency Ordered Stop   02/17/24 1130  entecavir  (BARACLUDE ) tablet 1 mg        1 mg Oral Daily 02/17/24 1032     02/17/24 1115  entecavir  (BARACLUDE ) tablet 1 mg  Status:  Discontinued        1 mg Oral Every 24 hours 02/17/24 1016 02/17/24 1032   02/17/24 1100  entecavir  (BARACLUDE ) tablet 1 mg  Status:  Discontinued        1 mg Oral Every 24 hours 02/17/24 1006 02/17/24 1007   02/17/24 1100  tenofovir  alafenamide (VEMLIDY ) tablet 25 mg  Status:  Discontinued        25 mg Oral Daily 02/17/24 1007 02/17/24 1016   02/16/24 1200  artesunate  110 MG IV syringe 365 mg  Status:  Discontinued        365 mg Intravenous Every 12 hours 02/16/24 1156 02/17/24 1006       Subjective:  No new complaints Wife at bedside today  Objective: Vitals:   02/18/24 2359 02/19/24 0321 02/19/24 0746 02/19/24 0806  BP: 120/76 126/79 128/80 119/83  Pulse: 65 69 70 87  Resp: 20  20 18 17   Temp: 97.7 F (36.5 C) 98.1 F (36.7 C) 98.1 F (36.7 C) 97.7 F (36.5 C)  TempSrc:    Oral  SpO2: 100% 100% 100% 99%  Weight:      Height:        Intake/Output Summary (Last 24 hours) at 02/19/2024 0948 Last data filed at 02/19/2024 0321 Gross per 24 hour  Intake 4308.06 ml  Output --  Net 4308.06 ml    Filed Weights   02/16/24 0916  Weight: (!) 151.6 kg    Examination:  General: No acute distress. Scleral icterus Cardiovascular: RRR Lungs: unlabored Abdomen: Soft, nontender, nondistended  Neurological: Alert and oriented 3.  Moves all extremities 4 with equal strength. Cranial nerves II through XII grossly intact.  No asterixis. Extremities: mild LE edema   Data Reviewed: I have personally reviewed following labs and imaging studies  CBC: Recent Labs  Lab 02/16/24 0915 02/17/24 0145 02/18/24 0719 02/19/24 0125  WBC 9.7 8.9 5.4 5.6  NEUTROABS 6.8 6.7 4.0 2.8  HGB 12.3* 12.2* 12.2* 12.5*  HCT 36.7* 36.0* 36.2* 36.6*  MCV 87.4 86.1 86.4 85.3  PLT 237 223 212 210    Basic Metabolic Panel: Recent Labs  Lab 02/16/24 0915 02/17/24 0145 02/18/24 0719 02/19/24 0125  NA 136 135 137 135  K 3.7 3.5 3.6 3.7  CL 103 103 104 104  CO2 24 24 24  21*  GLUCOSE 148* 131* 81 80  BUN 25* 23* 17 16  CREATININE 1.75* 1.53* 1.35* 1.20  CALCIUM 8.8* 8.7* 8.7* 8.9  MG  --  2.0 2.0 2.0  PHOS  --  5.9* 4.5 4.4    GFR: Estimated Creatinine Clearance: 120 mL/min (by C-G formula based on SCr of 1.2 mg/dL).  Liver Function Tests: Recent Labs  Lab 02/16/24 0915 02/16/24 1406 02/17/24 0145 02/18/24 0719 02/19/24 0125  AST 176* 198* 187* 250* 266*  ALT 105* 113* 102* 110* 127*  ALKPHOS 141* 159* 140* 150* 142*  BILITOT 29.6* 31.6* 27.3* 24.5* 24.4*  PROT 6.9 7.1 6.7 6.7 7.3  ALBUMIN 1.7* 1.8* 1.6* 1.6* 1.7*    CBG: No results for input(s): GLUCAP in the last 168 hours.   Recent Results (from the past 240 hours)  Blood culture (routine x 2)     Status: None (Preliminary result)   Collection Time: 02/16/24 12:40 PM   Specimen: BLOOD  Result Value Ref Range Status   Specimen Description BLOOD SITE NOT SPECIFIED  Final   Special Requests   Final    BOTTLES DRAWN AEROBIC AND ANAEROBIC Blood Culture adequate volume   Culture   Final    NO GROWTH 2 DAYS Performed at [facility], [address] [place], [place] [address]    Report Status PENDING  Incomplete  Blood culture (routine x 2)     Status: None (Preliminary result)   Collection Time: 02/16/24  4:57 PM   Specimen: BLOOD LEFT ARM   Result Value Ref Range Status   Specimen Description BLOOD LEFT ARM  Final   Special Requests   Final    BOTTLES DRAWN AEROBIC ONLY Blood Culture results may not be optimal due to an inadequate volume of blood received in culture bottles   Culture   Final    NO GROWTH 2 DAYS Performed at [facility], [address] [place], [place] [address]    Report Status PENDING  Incomplete         Radiology Studies: No results  found.       Scheduled Meds:  entecavir   1 mg Oral Daily   feeding supplement  1 Container Oral TID BM   hydrocortisone -pramoxine  1 applicator Rectal BID   lactulose   20 g Oral BID   pantoprazole  (PROTONIX ) IV  40 mg Intravenous Q12H   Continuous Infusions:  lactated ringers  100 mL/hr at 02/19/24 0057     LOS: 3 days    Time spent: over 30 min     Meliton Monte, MD [facility]   To contact the attending provider between 7A-7P or the covering provider during after hours 7P-7A, please log into the web site [email or web address] and access using universal [place] password for that web site. If you do not have the password, please call the hospital operator.  02/19/2024, 9:48 AM

## 2024-02-19 NOTE — Plan of Care (Signed)

## 2024-02-20 ENCOUNTER — Inpatient Hospital Stay (HOSPITAL_COMMUNITY)

## 2024-02-20 DIAGNOSIS — R008 Other abnormalities of heart beat: Secondary | ICD-10-CM

## 2024-02-20 DIAGNOSIS — R609 Edema, unspecified: Secondary | ICD-10-CM | POA: Diagnosis not present

## 2024-02-20 DIAGNOSIS — R17 Unspecified jaundice: Secondary | ICD-10-CM | POA: Diagnosis not present

## 2024-02-20 LAB — MISC LABCORP TEST (SEND OUT): Labcorp test code: 144012

## 2024-02-20 LAB — ECHOCARDIOGRAM COMPLETE
AR max vel: 3.19 cm2
AV Area VTI: 3.57 cm2
AV Area mean vel: 3.26 cm2
AV Mean grad: 3 mmHg
AV Peak grad: 6 mmHg
Ao pk vel: 1.22 m/s
Area-P 1/2: 3.1 cm2
Height: 76 in
S' Lateral: 3.7 cm
Weight: 5347.48 [oz_av]

## 2024-02-20 LAB — COMPREHENSIVE METABOLIC PANEL WITH GFR
ALT: 113 U/L — ABNORMAL HIGH (ref 0–44)
AST: 233 U/L — ABNORMAL HIGH (ref 15–41)
Albumin: <1.5 g/dL — ABNORMAL LOW (ref 3.5–5.0)
Alkaline Phosphatase: 129 U/L — ABNORMAL HIGH (ref 38–126)
Anion gap: 6 (ref 5–15)
BUN: 13 mg/dL (ref 6–20)
CO2: 25 mmol/L (ref 22–32)
Calcium: 8.5 mg/dL — ABNORMAL LOW (ref 8.9–10.3)
Chloride: 103 mmol/L (ref 98–111)
Creatinine, Ser: 1.18 mg/dL (ref 0.61–1.24)
GFR, Estimated: >60 mL/min (ref >60)
Glucose, Bld: 95 mg/dL (ref 70–99)
Potassium: 3.5 mmol/L (ref 3.5–5.1)
Sodium: 134 mmol/L — ABNORMAL LOW (ref 135–145)
Total Bilirubin: 20.2 mg/dL (ref 0.0–1.2)
Total Protein: 6.3 g/dL — ABNORMAL LOW (ref 6.5–8.1)

## 2024-02-20 LAB — CBC WITH DIFFERENTIAL/PLATELET
Abs Immature Granulocytes: 0.03 K/uL (ref 0.00–0.07)
Basophils Absolute: 0 K/uL (ref 0.0–0.1)
Basophils Relative: 1 %
Eosinophils Absolute: 0.1 K/uL (ref 0.0–0.5)
Eosinophils Relative: 3 %
HCT: 32.3 % — ABNORMAL LOW (ref 39.0–52.0)
Hemoglobin: 11.1 g/dL — ABNORMAL LOW (ref 13.0–17.0)
Immature Granulocytes: 1 %
Lymphocytes Relative: 30 %
Lymphs Abs: 1.4 K/uL (ref 0.7–4.0)
MCH: 29.5 pg (ref 26.0–34.0)
MCHC: 34.4 g/dL (ref 30.0–36.0)
MCV: 85.9 fL (ref 80.0–100.0)
Monocytes Absolute: 0.6 K/uL (ref 0.1–1.0)
Monocytes Relative: 14 %
Neutro Abs: 2.4 K/uL (ref 1.7–7.7)
Neutrophils Relative %: 51 %
Platelets: 181 K/uL (ref 150–400)
RBC: 3.76 MIL/uL — ABNORMAL LOW (ref 4.22–5.81)
RDW: 18.1 % — ABNORMAL HIGH (ref 11.5–15.5)
WBC: 4.6 K/uL (ref 4.0–10.5)
nRBC: 0 % (ref 0.0–0.2)

## 2024-02-20 LAB — PHOSPHORUS: Phosphorus: 4 mg/dL (ref 2.5–4.6)

## 2024-02-20 LAB — PROTIME-INR
INR: 1.4 — ABNORMAL HIGH (ref 0.8–1.2)
Prothrombin Time: 17.9 s — ABNORMAL HIGH (ref 11.4–15.2)

## 2024-02-20 LAB — MAGNESIUM: Magnesium: 1.9 mg/dL (ref 1.7–2.4)

## 2024-02-20 LAB — URINE CYTOLOGY ANCILLARY ONLY
Chlamydia: NEGATIVE
Comment: NEGATIVE
Comment: NEGATIVE
Comment: NORMAL
Neisseria Gonorrhea: NEGATIVE
Trichomonas: NEGATIVE

## 2024-02-20 LAB — PLASMODIUM SP. PCR: Plasmodium Sp. PCR: NEGATIVE

## 2024-02-20 MED ORDER — FUROSEMIDE 10 MG/ML IJ SOLN
20.0000 mg | Freq: Once | INTRAMUSCULAR | Status: AC
  Administered 2024-02-20: 20 mg via INTRAVENOUS
  Filled 2024-02-20: qty 2

## 2024-02-20 NOTE — Progress Notes (Signed)
 PROGRESS NOTE    William Gordon  FMW:[id number] DOB: 07/03/1975 DOA: 02/16/2024 PCP: Patient, No Pcp Per  Chief Complaint  Patient presents with   Fatigue   Constipation   Emesis   Nausea   eyes yellow   Weakness   Pruritis    Brief Narrative:   47 yo male with hx chronic hepatitis B, hypertension, obesity presenting with jaundice, pruritus, generalized weakness, fatigue, nausa/and vomiting.  Recently returned from [place] on the day of admission.  Reportedly treated for malaria there.    He's been admitted with jaundice, abnormal lfts.   Assessment & Plan:   Principal Problem:   Jaundice Active Problems:   Gastroesophageal reflux disease without esophagitis   Primary hypertension   Morbid obesity with body mass index (BMI) of 40.0 to 44.9 in adult (HCC)   Hepatitis C virus infection without hepatic coma  Decompensated Cirrhosis Chronic Hepatitis B Bilirubin 20.2,, AST 233, ALT 113, INR 1.4 platelets 181, albumin  <1.5 Appreciate ID and GI assistance Will continue entecavir  Hepatitis B core ab positive, hep B dna 1,620,000, hep B e ab nonreactive, hep B e ag postive, hepatitis B surface ab <3.5, hepatitis B surface ag (reactive).  Hep Shailey [facility]baugh total Ab reactive.  Hepatitis D IgG/IgM ([facility] sendout).  Hepatitis D virus RNA quantitative PCR test ([facility] sendout).   ASMA negative, AMA negative, ANA - pending Negative HIV Blood cultures pending Lactulose  (no encephalopathy, no asterixis at this time despite elevated ammonia) Consider repeat CT scan with contrast pending renal function Once stable will plan to discharge with outpatient follow up with GI  Ok to keep here given no signs of liver failure or encephalopathy to warrant transfer to transplant center  Elevated Ferritin  In the setting of above  Consider additional w/u as indicated  Lower Extremity Edema Hypoalbuminemia Follow echo, LE US  UA with 30 mg/dl protein Trial dose of lasix , follow   Haptoglobin is <10,  LDH elevated Technologist smear without notable findings I suspect low haptoglobin due to liver disease  AKI improving  Anemia Noted, trend  Hemorrhoids Topical management Pain meds as needed  Pending plasmodium sp PCR Pending parasite exam blood Parasite exam screen without plasmodium or other blood parasites on thin smears    DVT prophylaxis: SCD Code Status: full Family Communication: wife at bedside Disposition:   Status is: Inpatient Remains inpatient appropriate because: need for additional inpatient care   Consultants:  GI ID  Procedures:  none  Antimicrobials:  Anti-infectives (From admission, onward)    Start     Dose/Rate Route Frequency Ordered Stop   02/17/24 1130  entecavir  (BARACLUDE ) tablet 1 mg        1 mg Oral Daily 02/17/24 1032     02/17/24 1115  entecavir  (BARACLUDE ) tablet 1 mg  Status:  Discontinued        1 mg Oral Every 24 hours 02/17/24 1016 02/17/24 1032   02/17/24 1100  entecavir  (BARACLUDE ) tablet 1 mg  Status:  Discontinued        1 mg Oral Every 24 hours 02/17/24 1006 02/17/24 1007   02/17/24 1100  tenofovir  alafenamide (VEMLIDY ) tablet 25 mg  Status:  Discontinued        25 mg Oral Daily 02/17/24 1007 02/17/24 1016   02/16/24 1200  artesunate  110 MG IV syringe 365 mg  Status:  Discontinued        365 mg Intravenous Every 12 hours 02/16/24 1156 02/17/24 1006       Subjective:  C/o discomfort with bowel movement from hemorrhoids  Objective: Vitals:   02/19/24 2100 02/20/24 0010 02/20/24 0357 02/20/24 0822  BP: 117/76 124/75 (!) 115/56 115/75  Pulse: 71 72 72 67  Resp: 20 20 20    Temp: 98 F (36.7 C) 98 F (36.7 C) 98 F (36.7 C) 97.7 F (36.5 C)  TempSrc:    Oral  SpO2: 100% 100% 100% 100%  Weight:      Height:        Intake/Output Summary (Last 24 hours) at 02/20/2024 0950 Last data filed at 02/20/2024 0010 Gross per 24 hour  Intake 600 ml  Output --  Net 600 ml    Filed Weights   02/16/24 0916  Weight: (!)  151.6 kg    Examination:  General: No acute distress. Cardiovascular: RRR Lungs: unlabored Abdomen: Soft, nontender, nondistended  Neurological: Alert and oriented 3. Moves all extremities 4 with equal strength. Cranial nerves II through XII grossly intact. Extremities: 1+ LE edema, palpable distal pulses   Data Reviewed: I have personally reviewed following labs and imaging studies  CBC: Recent Labs  Lab 02/16/24 0915 02/17/24 0145 02/18/24 0719 02/19/24 0125 02/20/24 0323  WBC 9.7 8.9 5.4 5.6 4.6  NEUTROABS 6.8 6.7 4.0 2.8 2.4  HGB 12.3* 12.2* 12.2* 12.5* 11.1*  HCT 36.7* 36.0* 36.2* 36.6* 32.3*  MCV 87.4 86.1 86.4 85.3 85.9  PLT 237 223 212 210 181    Basic Metabolic Panel: Recent Labs  Lab 02/16/24 0915 02/17/24 0145 02/18/24 0719 02/19/24 0125 02/20/24 0323  NA 136 135 137 135 134*  K 3.7 3.5 3.6 3.7 3.5  CL 103 103 104 104 103  CO2 24 24 24  21* 25  GLUCOSE 148* 131* 81 80 95  BUN 25* 23* 17 16 13   CREATININE 1.75* 1.53* 1.35* 1.20 1.18  CALCIUM 8.8* 8.7* 8.7* 8.9 8.5*  MG  --  2.0 2.0 2.0 1.9  PHOS  --  5.9* 4.5 4.4 4.0    GFR: Estimated Creatinine Clearance: 122 mL/min (by C-G formula based on SCr of 1.18 mg/dL).  Liver Function Tests: Recent Labs  Lab 02/16/24 1406 02/17/24 0145 02/18/24 0719 02/19/24 0125 02/20/24 0323  AST 198* 187* 250* 266* 233*  ALT 113* 102* 110* 127* 113*  ALKPHOS 159* 140* 150* 142* 129*  BILITOT 31.6* 27.3* 24.5* 24.4* 20.2*  PROT 7.1 6.7 6.7 7.3 6.3*  ALBUMIN 1.8* 1.6* 1.6* 1.7* <1.5*    CBG: No results for input(s): GLUCAP in the last 168 hours.   Recent Results (from the past 240 hours)  Blood culture (routine x 2)     Status: None (Preliminary result)   Collection Time: 02/16/24 12:40 PM   Specimen: BLOOD  Result Value Ref Range Status   Specimen Description BLOOD SITE NOT SPECIFIED  Final   Special Requests   Final    BOTTLES DRAWN AEROBIC AND ANAEROBIC Blood Culture adequate volume   Culture    Final    NO GROWTH 4 DAYS Performed at [facility], [address], [place], [place] [address]    Report Status PENDING  Incomplete  Blood culture (routine x 2)     Status: None (Preliminary result)   Collection Time: 02/16/24  4:57 PM   Specimen: BLOOD LEFT ARM  Result Value Ref Range Status   Specimen Description BLOOD LEFT ARM  Final   Special Requests   Final    BOTTLES DRAWN AEROBIC ONLY Blood Culture results may not be optimal due to an  inadequate volume of blood received in culture bottles   Culture   Final    NO GROWTH 4 DAYS Performed at [facility], [address] [place], [place] [address]    Report Status PENDING  Incomplete         Radiology Studies: No results found.       Scheduled Meds:  entecavir   1 mg Oral Daily   feeding supplement  1 Container Oral TID BM   furosemide   20 mg Intravenous Once   hydrocortisone -pramoxine  1 applicator Rectal BID   lactulose   20 g Oral BID   pantoprazole  (PROTONIX ) IV  40 mg Intravenous Q12H   Continuous Infusions:     LOS: 4 days    Time spent: over 30 min     Meliton Monte, MD [facility]   To contact the attending provider between 7A-7P or the covering provider during after hours 7P-7A, please log into the web site [email or web address] and access using universal [place] password for that web site. If you do not have the password, please call the hospital operator.  02/20/2024, 9:50 AM

## 2024-02-20 NOTE — Plan of Care (Signed)
   Problem: Education: Goal: Knowledge of General Education information will improve Description: Including pain rating scale, medication(s)/side effects and non-pharmacologic comfort measures Outcome: Progressing   Problem: Health Behavior/Discharge Planning: Goal: Ability to manage health-related needs will improve Outcome: Progressing   Problem: Activity: Goal: Risk for activity intolerance will decrease Outcome: Progressing

## 2024-02-20 NOTE — Plan of Care (Signed)

## 2024-02-21 ENCOUNTER — Other Ambulatory Visit (HOSPITAL_COMMUNITY): Payer: Self-pay

## 2024-02-21 DIAGNOSIS — R17 Unspecified jaundice: Secondary | ICD-10-CM | POA: Diagnosis not present

## 2024-02-21 LAB — CBC WITH DIFFERENTIAL/PLATELET
Abs Immature Granulocytes: 0.03 K/uL (ref 0.00–0.07)
Basophils Absolute: 0 K/uL (ref 0.0–0.1)
Basophils Relative: 1 %
Eosinophils Absolute: 0.2 K/uL (ref 0.0–0.5)
Eosinophils Relative: 3 %
HCT: 32.9 % — ABNORMAL LOW (ref 39.0–52.0)
Hemoglobin: 11.4 g/dL — ABNORMAL LOW (ref 13.0–17.0)
Immature Granulocytes: 1 %
Lymphocytes Relative: 29 %
Lymphs Abs: 1.6 K/uL (ref 0.7–4.0)
MCH: 29.2 pg (ref 26.0–34.0)
MCHC: 34.7 g/dL (ref 30.0–36.0)
MCV: 84.4 fL (ref 80.0–100.0)
Monocytes Absolute: 0.9 K/uL (ref 0.1–1.0)
Monocytes Relative: 15 %
Neutro Abs: 3 K/uL (ref 1.7–7.7)
Neutrophils Relative %: 51 %
Platelets: 199 K/uL (ref 150–400)
RBC: 3.9 MIL/uL — ABNORMAL LOW (ref 4.22–5.81)
RDW: 18.2 % — ABNORMAL HIGH (ref 11.5–15.5)
WBC: 5.7 K/uL (ref 4.0–10.5)
nRBC: 0 % (ref 0.0–0.2)

## 2024-02-21 LAB — COMPREHENSIVE METABOLIC PANEL WITH GFR
ALT: 117 U/L — ABNORMAL HIGH (ref 0–44)
AST: 224 U/L — ABNORMAL HIGH (ref 15–41)
Albumin: <1.5 g/dL — ABNORMAL LOW (ref 3.5–5.0)
Alkaline Phosphatase: 139 U/L — ABNORMAL HIGH (ref 38–126)
Anion gap: 7 (ref 5–15)
BUN: 13 mg/dL (ref 6–20)
CO2: 25 mmol/L (ref 22–32)
Calcium: 8.6 mg/dL — ABNORMAL LOW (ref 8.9–10.3)
Chloride: 104 mmol/L (ref 98–111)
Creatinine, Ser: 1.24 mg/dL (ref 0.61–1.24)
GFR, Estimated: >60 mL/min (ref >60)
Glucose, Bld: 88 mg/dL (ref 70–99)
Potassium: 3.5 mmol/L (ref 3.5–5.1)
Sodium: 136 mmol/L (ref 135–145)
Total Bilirubin: 20.9 mg/dL (ref 0.0–1.2)
Total Protein: 6.3 g/dL — ABNORMAL LOW (ref 6.5–8.1)

## 2024-02-21 LAB — CULTURE, BLOOD (ROUTINE X 2)
Culture: NO GROWTH
Culture: NO GROWTH
Special Requests: ADEQUATE

## 2024-02-21 LAB — MAGNESIUM: Magnesium: 1.7 mg/dL (ref 1.7–2.4)

## 2024-02-21 LAB — PROTIME-INR
INR: 1.3 — ABNORMAL HIGH (ref 0.8–1.2)
Prothrombin Time: 17.2 s — ABNORMAL HIGH (ref 11.4–15.2)

## 2024-02-21 LAB — ANA W/REFLEX IF POSITIVE: Anti Nuclear Antibody (ANA): NEGATIVE

## 2024-02-21 LAB — PHOSPHORUS: Phosphorus: 3.6 mg/dL (ref 2.5–4.6)

## 2024-02-21 MED ORDER — PANTOPRAZOLE SODIUM 40 MG PO TBEC
40.0000 mg | DELAYED_RELEASE_TABLET | Freq: Every day | ORAL | 1 refills | Status: AC
  Filled 2024-02-21: qty 30, 30d supply, fill #0

## 2024-02-21 MED ORDER — OXYCODONE HCL 5 MG PO TABS
5.0000 mg | ORAL_TABLET | Freq: Three times a day (TID) | ORAL | 0 refills | Status: AC | PRN
  Filled 2024-02-21: qty 9, 3d supply, fill #0

## 2024-02-21 MED ORDER — FUROSEMIDE 20 MG PO TABS
20.0000 mg | ORAL_TABLET | Freq: Every day | ORAL | 0 refills | Status: AC
Start: 2024-02-21 — End: 2025-02-20
  Filled 2024-02-21: qty 30, 30d supply, fill #0

## 2024-02-21 MED ORDER — HYDROCORT-PRAMOXINE (PERIANAL) 1-1 % EX FOAM
1.0000 | Freq: Two times a day (BID) | CUTANEOUS | 0 refills | Status: AC
  Filled 2024-02-21: qty 1.7, 3d supply, fill #0

## 2024-02-21 MED ORDER — ENTECAVIR 1 MG PO TABS
1.0000 mg | ORAL_TABLET | Freq: Every day | ORAL | 0 refills | Status: DC
  Filled 2024-02-21: qty 90, 90d supply, fill #0

## 2024-02-21 MED ORDER — LACTULOSE 10 GM/15ML PO SOLN
20.0000 g | Freq: Every day | ORAL | 0 refills | Status: AC
  Filled 2024-02-21: qty 946, 31d supply, fill #0

## 2024-02-21 MED ORDER — POTASSIUM CHLORIDE CRYS ER 10 MEQ PO TBCR
10.0000 meq | EXTENDED_RELEASE_TABLET | Freq: Every day | ORAL | 0 refills | Status: AC
  Filled 2024-02-21: qty 30, 30d supply, fill #0

## 2024-02-21 NOTE — Discharge Summary (Signed)
 Physician Discharge Summary  William Gordon FMW:[id number] DOB: Jan 15, 1976 DOA: 02/16/2024  PCP: Patient, No Pcp Per  Admit date: 02/16/2024 Discharge date: 02/21/2024  Time spent: 40 minutes  Recommendations for Outpatient Follow-up:  Follow outpatient CBC/CMP  Follow with GI for repeat labs 9/11 Follow pending labs - Hep D virus RNA quant pCR and pending parasite exam blood  Follow LE swelling outpatient on lasix , adjust as needed - consider adding spironolactone Follow hemorrhoid symptoms outpatient Needs PCP   Discharge Diagnoses:  Principal Problem:   Jaundice Active Problems:   Gastroesophageal reflux disease without esophagitis   Primary hypertension   Morbid obesity with body mass index (BMI) of 40.0 to 44.9 in adult William Gordon)   Hepatitis C virus infection without hepatic coma   Discharge Condition: stable  Diet recommendation: low sodium, heart healthy  Filed Weights   02/16/24 0916  Weight: (!) 151.6 kg    History of present illness:   48 yo male with hx chronic hepatitis B, hypertension, obesity presenting with jaundice, pruritus, generalized weakness, fatigue, nausa/and vomiting.  Recently returned from [place] on the day of admission.  Reportedly treated for malaria there.     He's been admitted with jaundice, abnormal lfts.   He's been diagnosed with decompensated cirrhosis in the setting of his chronic hepatitis B infection.  He's gradually improved with resumption of his antivirals.  Plan for outpatient GI follow up.   Hospital Course:  Assessment and Plan:  Decompensated Cirrhosis Chronic Hepatitis B Bilirubin 20.9,, AST 224, ALT 117, INR 1.3 platelets 191, albumin  <1.5 Appreciate ID and GI assistance Will continue entecavir  Hepatitis B core ab positive, hep B dna 1,620,000, hep B e ab nonreactive, hep B e ag postive, hepatitis B surface ab <3.5, hepatitis B surface ag (reactive).  Hep William Gordon total Ab reactive.  Hepatitis D IgG/IgM nonreactive.  Pending Hepatitis  D virus RNA quantitative PCR test ([facility] sendout).   ASMA negative, AMA negative, ANA negative Negative HIV Blood cultures NGx5 Lactulose  (no encephalopathy, no asterixis at this time despite elevated ammonia) Discussed with GI, plan for discharge with outpatient follow up for repeat labs on 9/11.    Elevated Ferritin  In the setting of above  Consider additional w/u as indicated  Lower Extremity Edema Hypoalbuminemia Follow echo, LE US  UA with 30 mg/dl protein Start lasix , potassium at discharge - follow outpatient and adjust regimen as needed.  Will likely need spironolactone at some point.    Haptoglobin is <10, LDH elevated Technologist smear without notable findings I suspect low haptoglobin due to liver disease   AKI improving   Anemia Noted, trend   Hemorrhoids Topical management Pain meds as needed   Pending plasmodium sp PCR Pending parasite exam blood Parasite exam screen without plasmodium or other blood parasites on thin smears  Avascular Structure on Middle R Thigh Unclear significance, chronic, can be followed outpatient - discussed this with him    Procedures: Echo IMPRESSIONS     1. Left ventricular ejection fraction, by estimation, is 60 to 65%. The  left ventricle has normal function. The left ventricle has no regional  wall motion abnormalities. Left ventricular diastolic parameters were  normal.   2. Right ventricular systolic function is normal. The right ventricular  size is normal.   3. The mitral valve is normal in structure. No evidence of mitral valve  regurgitation. No evidence of mitral stenosis.   4. The aortic valve is tricuspid. There is mild calcification of the  aortic valve. Aortic  valve regurgitation is not visualized. Aortic valve  sclerosis is present, with no evidence of aortic valve stenosis.   5. The inferior vena cava is normal in size with greater than 50%  respiratory variability, suggesting right atrial pressure of  3 mmHg.    Summary:  BILATERAL:  - No evidence of deep vein thrombosis seen in the lower extremities,  bilaterally.  -No evidence of popliteal cyst, bilaterally.  RIGHT:      - Avascular solid structure noted on right middle thigh.    Consultations: GI ID  Discharge Exam: Vitals:   02/20/24 2004 02/21/24 0737  BP:  113/62  Pulse: 65 73  Resp: 18 18  Temp: 98.2 F (36.8 C) 97.9 F (36.6 C)  SpO2: 100% 100%   No new complaints Disappointed labs didn't improve today Wife at bedside  General: No acute distress. Icteric sclera Cardiovascular: RRR Lungs: unlabored Abdomen: Soft, nontender, nondistended Neurological: Alert and oriented 3. Moves all extremities 4 with equal strength. Cranial nerves II through XII grossly intact. Extremities: improved 1+ LE edema  Discharge Instructions   Discharge Instructions     Call MD for:  difficulty breathing, headache or visual disturbances   Complete by: As directed    Call MD for:  extreme fatigue   Complete by: As directed    Call MD for:  hives   Complete by: As directed    Call MD for:  persistant dizziness or light-headedness   Complete by: As directed    Call MD for:  persistant nausea and vomiting   Complete by: As directed    Call MD for:  redness, tenderness, or signs of infection (pain, swelling, redness, odor or green/yellow discharge around incision site)   Complete by: As directed    Call MD for:  severe uncontrolled pain   Complete by: As directed    Call MD for:  temperature >100.4   Complete by: As directed    Diet - low sodium heart healthy   Complete by: As directed    Discharge instructions   Complete by: As directed    You were seen for liver injury and cirrhosis related to your chronic hepatitis B infection.  Your labs show some gradual improvement from when you first presented.  Follow up with gastroenterology as an outpatient for repeat labs and follow up from this hospitalization.  Avoid  tylenol , alcohol, and any over the counter medicines (don't take anything without discussing with your doctor).  We'll prescribe [name] medicine for your swelling in your leg (lasix ).  I believe this is due to your cirrhosis.  You should follow up with Adilynne Fitzwater PCP as an outpatient for adjustment of this medicine and follow up of this hospitalization and other general medical issues.    You can use another 3 days of the topical medicine for your hemorrhoids, but don't take after this.    Return for new, recurrent, or worsening symptoms.  Please ask your PCP to request records from this hospitalization so they know what was done and what the next steps will be.   Increase activity slowly   Complete by: As directed       Allergies as of 02/21/2024       Reactions   Lisinopril Other (See Comments)   Erectile dysfunction   Pork-derived Products Other (See Comments)   Does not eat pork        Medication List     STOP taking these medications    Vemlidy  25  MG tablet Generic drug: tenofovir  alafenamide       TAKE these medications    entecavir  1 MG tablet Commonly known as: BARACLUDE  Take 1 tablet (1 mg total) by mouth daily. Start taking on: February 22, 2024   furosemide  20 MG tablet Commonly known as: Lasix  Take 1 tablet (20 mg total) by mouth daily. Follow with your outpatient doctor for repeat labs within 2 weeks to follow up your kidney function and electrolytes.   hydrocortisone -pramoxine rectal foam Commonly known as: PROCTOFOAM -HC Place 1 applicator rectally 2 (two) times daily for 3 days. (Do not use after 3 more days of treatment - you've already had 4 days in the hospital)   ibuprofen 200 MG tablet Commonly known as: ADVIL Take 200-400 mg by mouth every 6 (six) hours as needed (pain, headache).   lactulose  10 GM/15ML solution Commonly known as: CHRONULAC  Take 30 mLs (20 g total) by mouth daily.   OVER THE COUNTER MEDICATION Take 1 tablet by mouth daily.  Tauroursodeoxycholic Acid (TUDCA), dosage unknown   oxyCODONE  5 MG immediate release tablet Commonly known as: Oxy IR/ROXICODONE  Take 1 tablet (5 mg total) by mouth every 8 (eight) hours as needed for up to 3 days.   pantoprazole  40 MG tablet Commonly known as: Protonix  Take 1 tablet (40 mg total) by mouth daily.   potassium chloride  10 MEQ tablet Commonly known as: KLOR-CON  M Take 1 tablet (10 mEq total) by mouth daily. Follow repeat labs with your outpatient doctor within 2 weeks       Allergies  Allergen Reactions   Lisinopril Other (See Comments)    Erectile dysfunction   Pork-Derived Products Other (See Comments)    Does not eat pork    Follow-up Information     [place], Arnaz, FNP Follow up.   Specialty: Nurse Practitioner Why: Call the office and schedule Samaia Iwata hospital follow up in the next 7-10 days. Contact information: [address] [place] [address] [phone]                  The results of significant diagnostics from this hospitalization (including imaging, microbiology, ancillary and laboratory) are listed below for reference.    Significant Diagnostic Studies: ECHOCARDIOGRAM COMPLETE Result Date: 02/20/2024    ECHOCARDIOGRAM REPORT   Patient Name:   William Gordon Date of Exam: 02/20/2024 Medical Rec #:  [id number]        Height:       76.0 in Accession #:    [id number]       Weight:       334.2 lb Date of Birth:  10/19/1975         BSA:          2.755 m Patient Age:    48 years         BP:           115/75 mmHg Patient Gender: M                HR:           65 bpm. Exam Location:  Inpatient Procedure: 2D Echo, Cardiac Doppler and Color Doppler (Both Spectral and Color            Flow Doppler were utilized during procedure). Indications:    Other abnormalities of the heart R00.8  History:        Patient has no prior history of Echocardiogram examinations.  Risk Factors:Hypertension.  Sonographer:    Jayson Gaskins Referring  Phys: [phone] Catheryne Deford CALDWELL POWELL JR IMPRESSIONS  1. Left ventricular ejection fraction, by estimation, is 60 to 65%. The left ventricle has normal function. The left ventricle has no regional wall motion abnormalities. Left ventricular diastolic parameters were normal.  2. Right ventricular systolic function is normal. The right ventricular size is normal.  3. The mitral valve is normal in structure. No evidence of mitral valve regurgitation. No evidence of mitral stenosis.  4. The aortic valve is tricuspid. There is mild calcification of the aortic valve. Aortic valve regurgitation is not visualized. Aortic valve sclerosis is present, with no evidence of aortic valve stenosis.  5. The inferior vena cava is normal in size with greater than 50% respiratory variability, suggesting right atrial pressure of 3 mmHg. FINDINGS  Left Ventricle: Left ventricular ejection fraction, by estimation, is 60 to 65%. The left ventricle has normal function. The left ventricle has no regional wall motion abnormalities. Strain was performed and the global longitudinal strain is indeterminate. The left ventricular internal cavity size was normal in size. There is no left ventricular hypertrophy. Left ventricular diastolic parameters were normal. Right Ventricle: The right ventricular size is normal. No increase in right ventricular wall thickness. Right ventricular systolic function is normal. Left Atrium: Left atrial size was normal in size. Right Atrium: Right atrial size was normal in size. Pericardium: There is no evidence of pericardial effusion. Mitral Valve: The mitral valve is normal in structure. No evidence of mitral valve regurgitation. No evidence of mitral valve stenosis. Tricuspid Valve: The tricuspid valve is normal in structure. Tricuspid valve regurgitation is not demonstrated. No evidence of tricuspid stenosis. Aortic Valve: The aortic valve is tricuspid. There is mild calcification of the aortic valve. Aortic valve  regurgitation is not visualized. Aortic valve sclerosis is present, with no evidence of aortic valve stenosis. Aortic valve mean gradient measures 3.0 mmHg. Aortic valve peak gradient measures 6.0 mmHg. Aortic valve area, by VTI measures 3.57 cm. Pulmonic Valve: The pulmonic valve was normal in structure. Pulmonic valve regurgitation is trivial. No evidence of pulmonic stenosis. Aorta: The aortic root is normal in size and structure. Venous: The inferior vena cava is normal in size with greater than 50% respiratory variability, suggesting right atrial pressure of 3 mmHg. IAS/Shunts: The interatrial septum was not well visualized. Additional Comments: 3D was performed not requiring image post processing on an independent workstation and was indeterminate.  LEFT VENTRICLE PLAX 2D LVIDd:         5.70 cm   Diastology LVIDs:         3.70 cm   LV e' medial:    7.07 cm/s LV PW:         0.90 cm   LV E/e' medial:  8.7 LV IVS:        1.00 cm   LV e' lateral:   8.70 cm/s LVOT diam:     2.00 cm   LV E/e' lateral: 7.0 LV SV:         81 LV SV Index:   29 LVOT Area:     3.14 cm  RIGHT VENTRICLE RV S prime:     21.60 cm/s TAPSE (M-mode): 3.8 cm LEFT ATRIUM             Index        RIGHT ATRIUM           Index LA Vol (A2C):   58.7 ml 21.30 ml/m  RA Area:     27.00 cm LA Vol (A4C):   55.8 ml 20.25 ml/m  RA Volume:   99.00 ml  35.93 ml/m LA Biplane Vol: 57.0 ml 20.69 ml/m  AORTIC VALVE AV Area (Vmax):    3.19 cm AV Area (Vmean):   3.26 cm AV Area (VTI):     3.57 cm AV Vmax:           122.00 cm/s AV Vmean:          88.500 cm/s AV VTI:            0.227 m AV Peak Grad:      6.0 mmHg AV Mean Grad:      3.0 mmHg LVOT Vmax:         124.00 cm/s LVOT Vmean:        91.700 cm/s LVOT VTI:          0.258 m LVOT/AV VTI ratio: 1.14  AORTA Ao Root diam: 3.10 cm MITRAL VALVE MV Area (PHT): 3.10 cm    SHUNTS MV Decel Time: 245 msec    Systemic VTI:  0.26 m MV E velocity: 61.30 cm/s  Systemic Diam: 2.00 cm MV Dericka Ostenson velocity: 79.70 cm/s MV E/Shaarav Ripple  ratio:  0.77 Maude Emmer MD Electronically signed by Maude Emmer MD Signature Date/Time: 02/20/2024/2:17:31 PM    Final    VAS US  LOWER EXTREMITY VENOUS (DVT) Result Date: 02/20/2024  Lower Venous DVT Study Patient Name:  William Gordon  Date of Exam:   02/20/2024 Medical Rec #: [id number]         Accession #:    [id number] Date of Birth: 25-May-1976          Patient Gender: M Patient Age:   58 years Exam Location:  [facility] Procedure:      VAS US  LOWER EXTREMITY VENOUS (DVT) Referring Phys: Naraya Stoneberg POWELL JR --------------------------------------------------------------------------------  Indications: Edema.  Comparison Study: No prior exam. Performing Technologist: Edilia Elden Appl  Examination Guidelines: Kavin Weckwerth complete evaluation includes B-mode imaging, spectral Doppler, color Doppler, and power Doppler as needed of all accessible portions of each vessel. Bilateral testing is considered an integral part of Greogory Cornette complete examination. Limited examinations for reoccurring indications may be performed as noted. The reflux portion of the exam is performed with the patient in reverse Trendelenburg.  +---------+---------------+---------+-----------+----------+--------------+ RIGHT    CompressibilityPhasicitySpontaneityPropertiesThrombus Aging +---------+---------------+---------+-----------+----------+--------------+ CFV      Full           Yes      Yes                                 +---------+---------------+---------+-----------+----------+--------------+ SFJ      Full           Yes      Yes                                 +---------+---------------+---------+-----------+----------+--------------+ FV Prox  Full                                                        +---------+---------------+---------+-----------+----------+--------------+ FV Mid   Full                                                         +---------+---------------+---------+-----------+----------+--------------+  FV DistalFull                                                        +---------+---------------+---------+-----------+----------+--------------+ PFV      Full                                                        +---------+---------------+---------+-----------+----------+--------------+ POP      Full           Yes      Yes                                 +---------+---------------+---------+-----------+----------+--------------+ PTV      Full                                                        +---------+---------------+---------+-----------+----------+--------------+ PERO     Full                                                        +---------+---------------+---------+-----------+----------+--------------+ Patient concerned of bump on his middle anterior medial thigh, solid structure with no vascularity measuring 0.86 x 0.35 x 0.49 cm.  +---------+---------------+---------+-----------+----------+--------------+ LEFT     CompressibilityPhasicitySpontaneityPropertiesThrombus Aging +---------+---------------+---------+-----------+----------+--------------+ CFV      Full           Yes      Yes                                 +---------+---------------+---------+-----------+----------+--------------+ SFJ      Full           Yes      Yes                                 +---------+---------------+---------+-----------+----------+--------------+ FV Prox  Full                                                        +---------+---------------+---------+-----------+----------+--------------+ FV Mid   Full                                                        +---------+---------------+---------+-----------+----------+--------------+ FV DistalFull                                                         +---------+---------------+---------+-----------+----------+--------------+  PFV      Full                                                        +---------+---------------+---------+-----------+----------+--------------+ POP      Full           Yes      Yes                                 +---------+---------------+---------+-----------+----------+--------------+ PTV      Full                                                        +---------+---------------+---------+-----------+----------+--------------+ PERO     Full                                                        +---------+---------------+---------+-----------+----------+--------------+     Summary: BILATERAL: - No evidence of deep vein thrombosis seen in the lower extremities, bilaterally. -No evidence of popliteal cyst, bilaterally. RIGHT: - Avascular solid structure noted on right middle thigh.   *See table(s) above for measurements and observations. Electronically signed by Gaile New MD on 02/20/2024 at 1:43:59 PM.    Final    US  Abdomen Limited RUQ (LIVER/GB) Result Date: 02/16/2024 CLINICAL DATA:  Bilirubinemia EXAM: ULTRASOUND ABDOMEN LIMITED RIGHT UPPER QUADRANT COMPARISON:  Ultrasound 05/22/2022.  MRI 06/06/2022 FINDINGS: Gallbladder: Gallbladder wall is thickened at 5 mm. Gallbladder is mildly contracted. No visible stones or sonographic Murphy sign. Common bile duct: Diameter: Normal caliber, 1 mm Liver: Shrunken liver with nodular contour suggesting cirrhosis. No focal hepatic abnormality. Portal vein is patent on color Doppler imaging with normal direction of blood flow towards the liver. Other: None. IMPRESSION: Liver appears shrunken with nodular contour suggesting cirrhosis. Gallbladder is contracted without visible stones. Mild wall thickening could be related to contracted state or liver disease. Electronically Signed   By: Franky Crease M.D.   On: 02/16/2024 12:19   DG Chest 2 View Result Date:  02/16/2024 CLINICAL DATA:  Fevers, recent return from less [place]. EXAM: CHEST - 2 VIEW COMPARISON:  01/31/2007 FINDINGS: Normal mediastinum and cardiac silhouette. Normal pulmonary vasculature. No evidence of effusion, infiltrate, or pneumothorax. No acute bony abnormality. IMPRESSION: No acute cardiopulmonary process. Electronically Signed   By: Jackquline Boxer M.D.   On: 02/16/2024 10:32    Microbiology: Recent Results (from the past 240 hours)  Blood culture (routine x 2)     Status: None   Collection Time: 02/16/24 12:40 PM   Specimen: BLOOD  Result Value Ref Range Status   Specimen Description BLOOD SITE NOT SPECIFIED  Final   Special Requests   Final    BOTTLES DRAWN AEROBIC AND ANAEROBIC Blood Culture adequate volume   Culture   Final    NO GROWTH 5 DAYS Performed at [facility], [address] [place], [place] [address]    Report Status 02/21/2024  FINAL  Final  Blood culture (routine x 2)     Status: None   Collection Time: 02/16/24  4:57 PM   Specimen: BLOOD LEFT ARM  Result Value Ref Range Status   Specimen Description BLOOD LEFT ARM  Final   Special Requests   Final    BOTTLES DRAWN AEROBIC ONLY Blood Culture results may not be optimal due to an inadequate volume of blood received in culture bottles   Culture   Final    NO GROWTH 5 DAYS Performed at [facility], [address] [place], [place] [address]    Report Status 02/21/2024 FINAL  Final     Labs: Basic Metabolic Panel: Recent Labs  Lab 02/17/24 0145 02/18/24 0719 02/19/24 0125 02/20/24 0323 02/21/24 0320  NA 135 137 135 134* 136  K 3.5 3.6 3.7 3.5 3.5  CL 103 104 104 103 104  CO2 24 24 21* 25 25  GLUCOSE 131* 81 80 95 88  BUN 23* 17 16 13 13   CREATININE 1.53* 1.35* 1.20 1.18 1.24  CALCIUM 8.7* 8.7* 8.9 8.5* 8.6*  MG 2.0 2.0 2.0 1.9 1.7  PHOS 5.9* 4.5 4.4 4.0 3.6   Liver Function Tests: Recent Labs  Lab 02/17/24 0145 02/18/24 0719 02/19/24 0125 02/20/24 0323 02/21/24 0320   AST 187* 250* 266* 233* 224*  ALT 102* 110* 127* 113* 117*  ALKPHOS 140* 150* 142* 129* 139*  BILITOT 27.3* 24.5* 24.4* 20.2* 20.9*  PROT 6.7 6.7 7.3 6.3* 6.3*  ALBUMIN 1.6* 1.6* 1.7* <1.5* <1.5*   Recent Labs  Lab 02/16/24 0915  LIPASE 46   Recent Labs  Lab 02/16/24 2004  AMMONIA 88*   CBC: Recent Labs  Lab 02/17/24 0145 02/18/24 0719 02/19/24 0125 02/20/24 0323 02/21/24 0320  WBC 8.9 5.4 5.6 4.6 5.7  NEUTROABS 6.7 4.0 2.8 2.4 3.0  HGB 12.2* 12.2* 12.5* 11.1* 11.4*  HCT 36.0* 36.2* 36.6* 32.3* 32.9*  MCV 86.1 86.4 85.3 85.9 84.4  PLT 223 212 210 181 199   Cardiac Enzymes: Recent Labs  Lab 02/16/24 0940  CKTOTAL 197   BNP: BNP (last 3 results) No results for input(s): BNP in the last 8760 hours.  ProBNP (last 3 results) No results for input(s): PROBNP in the last 8760 hours.  CBG: No results for input(s): GLUCAP in the last 168 hours.     Signed:  Meliton Monte MD.  [facility] 02/21/2024, 11:09 AM

## 2024-02-21 NOTE — Plan of Care (Signed)
  Problem: Education: Goal: Knowledge of General Education information will improve Description: Including pain rating scale, medication(s)/side effects and non-pharmacologic comfort measures 02/21/2024 1216 by Jacquelyn Heather RAMAN, RN Outcome: Adequate for Discharge 02/21/2024 1200 by Jacquelyn Heather RAMAN, RN Outcome: Adequate for Discharge   Problem: Health Behavior/Discharge Planning: Goal: Ability to manage health-related needs will improve 02/21/2024 1216 by Jacquelyn Heather RAMAN, RN Outcome: Adequate for Discharge 02/21/2024 1200 by Jacquelyn Heather RAMAN, RN Outcome: Adequate for Discharge   Problem: Clinical Measurements: Goal: Ability to maintain clinical measurements within normal limits will improve 02/21/2024 1216 by Jacquelyn Heather RAMAN, RN Outcome: Adequate for Discharge 02/21/2024 1200 by Jacquelyn Heather RAMAN, RN Outcome: Adequate for Discharge Goal: Will remain free from infection 02/21/2024 1216 by Jacquelyn Heather RAMAN, RN Outcome: Adequate for Discharge 02/21/2024 1200 by Jacquelyn Heather RAMAN, RN Outcome: Adequate for Discharge Goal: Diagnostic test results will improve 02/21/2024 1216 by Jacquelyn Heather RAMAN, RN Outcome: Adequate for Discharge 02/21/2024 1200 by Jacquelyn Heather RAMAN, RN Outcome: Adequate for Discharge Goal: Respiratory complications will improve 02/21/2024 1216 by Jacquelyn Heather RAMAN, RN Outcome: Adequate for Discharge 02/21/2024 1200 by Jacquelyn Heather RAMAN, RN Outcome: Adequate for Discharge Goal: Cardiovascular complication will be avoided 02/21/2024 1216 by Jacquelyn Heather RAMAN, RN Outcome: Adequate for Discharge 02/21/2024 1200 by Jacquelyn Heather RAMAN, RN Outcome: Adequate for Discharge   Problem: Activity: Goal: Risk for activity intolerance will decrease 02/21/2024 1216 by Jacquelyn Heather RAMAN, RN Outcome: Adequate for Discharge 02/21/2024 1200 by Jacquelyn Heather RAMAN, RN Outcome: Adequate for Discharge    Problem: Nutrition: Goal: Adequate nutrition will be maintained 02/21/2024 1216 by Jacquelyn Heather RAMAN, RN Outcome: Adequate for Discharge 02/21/2024 1200 by Jacquelyn Heather RAMAN, RN Outcome: Adequate for Discharge   Problem: Coping: Goal: Level of anxiety will decrease 02/21/2024 1216 by Jacquelyn Heather RAMAN, RN Outcome: Adequate for Discharge 02/21/2024 1200 by Jacquelyn Heather RAMAN, RN Outcome: Adequate for Discharge   Problem: Elimination: Goal: Will not experience complications related to bowel motility 02/21/2024 1216 by Jacquelyn Heather RAMAN, RN Outcome: Adequate for Discharge 02/21/2024 1200 by Jacquelyn Heather RAMAN, RN Outcome: Adequate for Discharge Goal: Will not experience complications related to urinary retention 02/21/2024 1216 by Jacquelyn Heather RAMAN, RN Outcome: Adequate for Discharge 02/21/2024 1200 by Jacquelyn Heather RAMAN, RN Outcome: Adequate for Discharge   Problem: Pain Managment: Goal: General experience of comfort will improve and/or be controlled 02/21/2024 1216 by Jacquelyn Heather RAMAN, RN Outcome: Adequate for Discharge 02/21/2024 1200 by Jacquelyn Heather RAMAN, RN Outcome: Adequate for Discharge   Problem: Safety: Goal: Ability to remain free from injury will improve 02/21/2024 1216 by Jacquelyn Heather RAMAN, RN Outcome: Adequate for Discharge 02/21/2024 1200 by Jacquelyn Heather RAMAN, RN Outcome: Adequate for Discharge   Problem: Skin Integrity: Goal: Risk for impaired skin integrity will decrease 02/21/2024 1216 by Jacquelyn Heather RAMAN, RN Outcome: Adequate for Discharge 02/21/2024 1200 by Jacquelyn Heather RAMAN, RN Outcome: Adequate for Discharge

## 2024-02-21 NOTE — Plan of Care (Signed)

## 2024-02-21 NOTE — Progress Notes (Signed)
 Transition of Care [facility]) - Inpatient Brief Assessment   Patient Details  Name: William Gordon MRN: [id number] Date of Birth: 1976/05/06  Transition of Care [facility]) CM/SW Contact:    Rosaline JONELLE Joe, RN Phone Number: 02/21/2024, 9:30 AM   Clinical Narrative: Patient admitted for jaundice and elevated LFTs.  Patient lives at home and will return home when stable.  No IP Care management needs at this time.   Transition of Care Asessment: Insurance and Status: (P) Insurance coverage has been reviewed Patient has primary care physician: (P) Yes Home environment has been reviewed: (P) from home Prior level of function:: (P) self Prior/Current Home Services: (P) No current home services Social Drivers of Health Review: (P) SDOH reviewed no interventions necessary Readmission risk has been reviewed: (P) Yes Transition of care needs: (P) no transition of care needs at this time

## 2024-02-21 NOTE — Progress Notes (Signed)
 Transition of Care [facility]) - Inpatient Brief Assessment   Patient Details  Name: William Gordon MRN: [id number] Date of Birth: [birth year]  Transition of Care [facility]) CM/SW Contact:    Rosaline JONELLE Joe, RN Phone Number: 02/21/2024, 12:05 PM   Clinical Narrative: CM met with the patient at the bedside to discuss set up with PCP for hospital follow up.  Patient states that he has been going to [facility] for PCP and plans to return.  I called the office and scheduled a hospital follow up on February 28, 2024 at 2 pm.  PCP office is in network with patient's Medicaid.  Resources for hemorrhoids was placed in the AVs for information since patient just had BM and states that he suffers from pain after BM.  No other IP Care management needs at this time.   Transition of Care Asessment: Insurance and Status: Insurance coverage has been reviewed Patient has primary care physician: Yes Home environment has been reviewed: from home Prior level of function:: self Prior/Current Home Services: No current home services Social Drivers of Health Review: SDOH reviewed no interventions necessary Readmission risk has been reviewed: Yes Transition of care needs: (P) transition of care needs identified, TOC will continue to follow

## 2024-02-23 DIAGNOSIS — B181 Chronic viral hepatitis B without delta-agent: Secondary | ICD-10-CM | POA: Diagnosis not present

## 2024-02-24 LAB — MISC LABCORP TEST (SEND OUT): Labcorp test code: 550653

## 2024-02-27 ENCOUNTER — Observation Stay (HOSPITAL_COMMUNITY)
Admission: EM | Admit: 2024-02-27 | Discharge: 2024-02-29 | Disposition: A | Discharge status: Home / Self Care | Attending: Internal Medicine | Admitting: Internal Medicine

## 2024-02-27 ENCOUNTER — Other Ambulatory Visit: Payer: Self-pay

## 2024-02-27 DIAGNOSIS — K7290 Hepatic failure, unspecified without coma: Secondary | ICD-10-CM | POA: Diagnosis not present

## 2024-02-27 DIAGNOSIS — B181 Chronic viral hepatitis B without delta-agent: Secondary | ICD-10-CM

## 2024-02-27 DIAGNOSIS — K7210 Chronic hepatic failure without coma: Secondary | ICD-10-CM | POA: Diagnosis not present

## 2024-02-27 DIAGNOSIS — R8271 Bacteriuria: Secondary | ICD-10-CM

## 2024-02-27 DIAGNOSIS — K219 Gastro-esophageal reflux disease without esophagitis: Secondary | ICD-10-CM | POA: Diagnosis not present

## 2024-02-27 DIAGNOSIS — L299 Pruritus, unspecified: Secondary | ICD-10-CM | POA: Diagnosis present

## 2024-02-27 DIAGNOSIS — R600 Localized edema: Secondary | ICD-10-CM

## 2024-02-27 DIAGNOSIS — K7460 Unspecified cirrhosis of liver: Principal | ICD-10-CM | POA: Diagnosis present

## 2024-02-27 LAB — URINALYSIS, MICROSCOPIC (REFLEX)

## 2024-02-27 LAB — URINALYSIS, ROUTINE W REFLEX MICROSCOPIC
Glucose, UA: >=500 mg/dL — AB
Ketones, ur: NEGATIVE mg/dL
Nitrite: NEGATIVE
Protein, ur: 100 mg/dL — AB
Specific Gravity, Urine: 1.02 (ref 1.005–1.030)
pH: 6.5 (ref 5.0–8.0)

## 2024-02-27 LAB — CBC
HCT: 41.7 % (ref 39.0–52.0)
Hemoglobin: 13.1 g/dL (ref 13.0–17.0)
MCH: 29.6 pg (ref 26.0–34.0)
MCHC: 31.4 g/dL (ref 30.0–36.0)
MCV: 94.1 fL (ref 80.0–100.0)
Platelets: 209 K/uL (ref 150–400)
RBC: 4.43 MIL/uL (ref 4.22–5.81)
RDW: 20.5 % — ABNORMAL HIGH (ref 11.5–15.5)
WBC: 7.8 K/uL (ref 4.0–10.5)
nRBC: 0 % (ref 0.0–0.2)

## 2024-02-27 LAB — COMPREHENSIVE METABOLIC PANEL WITH GFR
ALT: 118 U/L — ABNORMAL HIGH (ref 0–44)
AST: 238 U/L — ABNORMAL HIGH (ref 15–41)
Albumin: 1.6 g/dL — ABNORMAL LOW (ref 3.5–5.0)
Alkaline Phosphatase: 179 U/L — ABNORMAL HIGH (ref 38–126)
Anion gap: 10 (ref 5–15)
BUN: 12 mg/dL (ref 6–20)
CO2: 20 mmol/L — ABNORMAL LOW (ref 22–32)
Calcium: 8.8 mg/dL — ABNORMAL LOW (ref 8.9–10.3)
Chloride: 105 mmol/L (ref 98–111)
Creatinine, Ser: 1.18 mg/dL (ref 0.61–1.24)
GFR, Estimated: >60 mL/min (ref >60)
Glucose, Bld: 142 mg/dL — ABNORMAL HIGH (ref 70–99)
Potassium: 3.9 mmol/L (ref 3.5–5.1)
Sodium: 135 mmol/L (ref 135–145)
Total Bilirubin: 25.2 mg/dL (ref 0.0–1.2)
Total Protein: 7.5 g/dL (ref 6.5–8.1)

## 2024-02-27 LAB — PROTIME-INR
INR: 1.5 — ABNORMAL HIGH (ref 0.8–1.2)
Prothrombin Time: 19 s — ABNORMAL HIGH (ref 11.4–15.2)

## 2024-02-27 LAB — PARASITE EXAM, BLOOD

## 2024-02-27 LAB — LIPASE, BLOOD: Lipase: 42 U/L (ref 11–51)

## 2024-02-27 LAB — AMMONIA: Ammonia: 48 umol/L — ABNORMAL HIGH (ref 9–35)

## 2024-02-27 MED ORDER — FUROSEMIDE 20 MG PO TABS
20.0000 mg | ORAL_TABLET | Freq: Every day | ORAL | Status: DC
  Administered 2024-02-28 – 2024-02-29 (×2): 20 mg via ORAL
  Filled 2024-02-27 (×2): qty 1

## 2024-02-27 MED ORDER — PANTOPRAZOLE SODIUM 40 MG PO TBEC
40.0000 mg | DELAYED_RELEASE_TABLET | Freq: Every day | ORAL | Status: DC
  Administered 2024-02-28 – 2024-02-29 (×2): 40 mg via ORAL
  Filled 2024-02-27 (×2): qty 1

## 2024-02-27 MED ORDER — ENTECAVIR 0.5 MG PO TABS
1.0000 mg | ORAL_TABLET | Freq: Every day | ORAL | Status: DC
  Administered 2024-02-28 – 2024-02-29 (×2): 1 mg via ORAL
  Filled 2024-02-27 (×2): qty 2

## 2024-02-27 MED ORDER — LACTULOSE 10 GM/15ML PO SOLN
20.0000 g | Freq: Every day | ORAL | Status: DC
  Administered 2024-02-28 – 2024-02-29 (×2): 20 g via ORAL
  Filled 2024-02-27 (×2): qty 30

## 2024-02-27 MED ORDER — DIPHENHYDRAMINE HCL 25 MG PO CAPS
25.0000 mg | ORAL_CAPSULE | Freq: Four times a day (QID) | ORAL | Status: AC | PRN
  Administered 2024-02-28: 25 mg via ORAL
  Filled 2024-02-27: qty 1

## 2024-02-27 NOTE — ED Notes (Signed)
 Pt is difficult stick. 2 failed attempts by this RN.  IV Team will be consulted.

## 2024-02-27 NOTE — ED Provider Triage Note (Signed)
 Emergency Medicine Provider Triage Evaluation Note  William Gordon , a 48 y.o. male  was evaluated in triage.  Pt complains of weakness, jaundice.  Patient reports he has ongoing and progressive weakness since Friday morning.  States that he was recently admitted for concerns of hepatitis and cirrhosis.  Patient continues to have jaundice and itching as well as worsening weakness.  No reported fevers, chills, body aches, nausea, vomiting, or diarrhea.  He does report primarily epigastric pain with eating.  Review of Systems  Positive: As above Negative: As above  Physical Exam  BP 130/72   Pulse 82   Temp 99.7 F (37.6 C)   Resp 18   Ht 6' 4 (1.93 m)   SpO2 97%   BMI 40.68 kg/m  Gen:   Awake, no distress   Resp:  Normal effort  MSK:   Moves extremities without difficulty  Other:  Scleral icterus  Medical Decision Making  Medically screening exam initiated at 1:29 PM.  Appropriate orders placed.  Dravyn Sween was informed that the remainder of the evaluation will be completed by another provider, this initial triage assessment does not replace that evaluation, and the importance of remaining in the ED until their evaluation is complete.     Jesua Tamblyn A, PA-C 02/27/24 1330

## 2024-02-27 NOTE — ED Triage Notes (Addendum)
 Patient arrives for eval of generalized weakness, worse since Friday morning. Admission last week for hepatitis/cirrhosis. Continues to have jaundice, itching, and weakness despite taking the prescribed medication and benadryl . Endorses abdominal pain after every meal.

## 2024-02-27 NOTE — H&P (Signed)
 History and Physical    William Gordon FMW:[id number] DOB: April 15, 1976 DOA: 02/27/2024  PCP: Patient, No Pcp Per  Patient coming from: Home  Chief Complaint: Itching  HPI: William Gordon is a 48 y.o. male with medical history significant of chronic hepatitis B, hypertension, obesity.  Recently admitted 9/4-9/9 for jaundice, pruritus, generalized weakness, fatigue, nausea/vomiting in the setting of returning from [place] on the day of admission.  He was treated for malaria there.  Patient was admitted for jaundice and abnormal LFTs.  He was diagnosed with decompensated cirrhosis in the setting of chronic hepatitis B likely exacerbated by recent hepatotoxic meds given to him for presumed malaria and noncompliance with his tenofovir  for the past 3+ months when he ran out while being in [place] most of this year.  ID and GI were consulted.  He was started on Entecavir  and also lactulose  due to elevated ammonia level.  He was also started on Lasix  at discharge for lower extremity edema.  Patient is presenting today with a chief complaint of itching.  States he is taking Benadryl  once a day but continues to have a lot of itching all over his body and is constantly scratching his skin.  States his eyes are still yellow and he is reporting generalized weakness.  Reports subjective fevers.  Denies nausea, vomiting, or abdominal pain.  He is taking all of the medications he was prescribed at discharge.  Denies shortness of breath or chest pain.  ED Course: Vital signs stable.  Labs showing no leukocytosis, hemoglobin improved and currently within normal range, platelet count normal, bicarb 20, glucose 142, creatinine 1.1 (stable), albumin 1.6, AST 238, ALT 118, alk phos 179, T. bili 25.2 (previously 20.9 on labs 6 days ago), lipase normal, PT 19.0, INR 1.5, ammonia level 48 (improved).  UA with large amount of bilirubin, negative nitrite, trace leukocytes, 0-5 WBCs, and many bacteria.  EDP discussed the case with  gastroenterologist Dr. Dianna who requested admission for observation by medicine service and [facility] will consult in the morning.   Review of Systems:  Review of Systems  All other systems reviewed and are negative.   No past medical history on file.  No past surgical history on file.   reports that he has never smoked. He does not have any smokeless tobacco history on file. He reports that he does not drink alcohol and does not use drugs.  Allergies  Allergen Reactions   Lisinopril Other (See Comments)    Erectile dysfunction   Pork-Derived Products Other (See Comments)    Does not eat pork    No family history on file.  Prior to Admission medications   Medication Sig Start Date End Date Taking? Authorizing Provider  entecavir  (BARACLUDE ) 1 MG tablet Take 1 tablet (1 mg total) by mouth daily. 02/22/24 05/22/24  Perri DELENA Meliton Mickey., MD  furosemide  (LASIX ) 20 MG tablet Take 1 tablet (20 mg total) by mouth daily. Follow with your outpatient doctor for repeat labs within 2 weeks to follow up your kidney function and electrolytes. 02/21/24 02/20/25  Perri DELENA Meliton Mickey., MD  ibuprofen (ADVIL) 200 MG tablet Take 200-400 mg by mouth every 6 (six) hours as needed (pain, headache).    [provider]  lactulose  (CHRONULAC ) 10 GM/15ML solution Take 30 mLs (20 g total) by mouth daily. 02/21/24   Perri DELENA Meliton Mickey., MD  OVER THE COUNTER MEDICATION Take 1 tablet by mouth daily. Tauroursodeoxycholic Acid (TUDCA), dosage unknown    [provider]  pantoprazole  (PROTONIX ) 40 MG tablet Take 1 tablet (40 mg total) by mouth daily. 02/21/24 02/20/25  Perri DELENA Meliton Mickey., MD  potassium chloride  (KLOR-CON  M) 10 MEQ tablet Take 1 tablet (10 mEq total) by mouth daily. Follow repeat labs with your outpatient doctor within 2 weeks 02/21/24 03/22/24  Perri DELENA Meliton Mickey., MD    Physical Exam: Vitals:   02/27/24 1843 02/27/24 1915 02/27/24 1945 02/27/24 1953  BP: 124/71 127/74 124/71  124/71  Pulse: 67   79  Resp: 15 20 19  (!) 21  Temp: 98.2 F (36.8 C)     TempSrc: Oral     SpO2: 100%   100%  Height:        Physical Exam Vitals reviewed.  Constitutional:      General: He is not in acute distress. HENT:     Head: Normocephalic and atraumatic.  Eyes:     General: Scleral icterus present.     Extraocular Movements: Extraocular movements intact.  Cardiovascular:     Rate and Rhythm: Normal rate and regular rhythm.     Heart sounds: Normal heart sounds.  Pulmonary:     Effort: Pulmonary effort is normal. No respiratory distress.     Breath sounds: Normal breath sounds.  Abdominal:     General: Bowel sounds are normal.     Palpations: Abdomen is soft.     Tenderness: There is no abdominal tenderness. There is no guarding.  Musculoskeletal:     Cervical back: Normal range of motion.     Right lower leg: Edema present.     Left lower leg: Edema present.     Comments: Mild pitting edema of bilateral lower extremities  Skin:    General: Skin is warm and dry.  Neurological:     General: No focal deficit present.     Mental Status: He is alert and oriented to person, place, and time.     Labs on Admission: I have personally reviewed following labs and imaging studies  CBC: Recent Labs  Lab 02/21/24 0320 02/27/24 1235  WBC 5.7 7.8  NEUTROABS 3.0  --   HGB 11.4* 13.1  HCT 32.9* 41.7  MCV 84.4 94.1  PLT 199 209   Basic Metabolic Panel: Recent Labs  Lab 02/21/24 0320 02/27/24 1235  NA 136 135  K 3.5 3.9  CL 104 105  CO2 25 20*  GLUCOSE 88 142*  BUN 13 12  CREATININE 1.24 1.18  CALCIUM 8.6* 8.8*  MG 1.7  --   PHOS 3.6  --    GFR: Estimated Creatinine Clearance: 122 mL/min (by C-G formula based on SCr of 1.18 mg/dL). Liver Function Tests: Recent Labs  Lab 02/21/24 0320 02/27/24 1235  AST 224* 238*  ALT 117* 118*  ALKPHOS 139* 179*  BILITOT 20.9* 25.2*  PROT 6.3* 7.5  ALBUMIN <1.5* 1.6*   Recent Labs  Lab 02/27/24 1235  LIPASE  42   Recent Labs  Lab 02/27/24 1859  AMMONIA 48*   Coagulation Profile: Recent Labs  Lab 02/21/24 0320 02/27/24 1850  INR 1.3* 1.5*   Cardiac Enzymes: No results for input(s): CKTOTAL, CKMB, CKMBINDEX, TROPONINI in the last 168 hours. BNP (last 3 results) No results for input(s): PROBNP in the last 8760 hours. HbA1C: No results for input(s): HGBA1C in the last 72 hours. CBG: No results for input(s): GLUCAP in the last 168 hours. Lipid Profile: No results for input(s): CHOL, HDL, LDLCALC, TRIG, CHOLHDL, LDLDIRECT in the last 72 hours.  Thyroid Function Tests: No results for input(s): TSH, T4TOTAL, FREET4, T3FREE, THYROIDAB in the last 72 hours. Anemia Panel: No results for input(s): VITAMINB12, FOLATE, FERRITIN, TIBC, IRON, RETICCTPCT in the last 72 hours. Urine analysis:    Component Value Date/Time   COLORURINE AMBER (A) 02/27/2024 1831   APPEARANCEUR CLEAR 02/27/2024 1831   LABSPEC 1.020 02/27/2024 1831   PHURINE 6.5 02/27/2024 1831   GLUCOSEU >=500 (A) 02/27/2024 1831   HGBUR TRACE (A) 02/27/2024 1831   BILIRUBINUR LARGE (A) 02/27/2024 1831   KETONESUR NEGATIVE 02/27/2024 1831   PROTEINUR 100 (A) 02/27/2024 1831   UROBILINOGEN 0.2 12/14/2015 1541   NITRITE NEGATIVE 02/27/2024 1831   LEUKOCYTESUR TRACE (A) 02/27/2024 1831    Radiological Exams on Admission: No results found.  Assessment and Plan  Decompensated cirrhosis in the setting of chronic hepatitis B Recently admitted 9/4-9/9 for this problem and ID has started him on Entecavir .  He returns today due to ongoing itching, jaundice, and generalized weakness despite taking prescribed medications.  No fever or leukocytosis.  AST 238, ALT 118, alk phos 179, T. bili 25.2 (previously 20.9 on labs 6 days ago), PT 19.0, INR 1.5, ammonia level improving.  No signs of hepatic encephalopathy.   EDP discussed the case with gastroenterologist Dr. Dianna who requested  admission for observation by medicine service and [facility] will consult in the morning.  Continue to monitor LFTs, PT/INR.  Continue Entecavir , lactulose , and Benadryl  PRN itching.  Bilateral lower extremity edema Lungs clear on exam and recent echo done a week ago showing normal EF.  Continue oral Lasix .  GERD Continue Protonix .  Asymptomatic bacteriuria UA with negative nitrite, trace leukocytes, 0-5 WBCs, and many bacteria.  Patient is not endorsing any urinary symptoms.  No fever or leukocytosis.  No indication for antibiotics at this time.  DVT prophylaxis: SCDs Code Status: Full Code (discussed with the patient and his wife) Family Communication: Wife at bedside. Consults called: [facility] Level of care: Telemetry bed Admission status: It is my clinical opinion that referral for OBSERVATION is reasonable and necessary in this patient based on the above information provided. The aforementioned taken together are felt to place the patient at high risk for further clinical deterioration. However, it is anticipated that the patient may be medically stable for discharge from the hospital within 24 to 48 hours.  Editha Ram MD [facility]  If 7PM-7AM, please contact night-coverage [email or web address]  02/27/2024, 9:59 PM

## 2024-02-27 NOTE — ED Provider Notes (Signed)
 [facility] AT [facility] Provider Note   CSN: [id number] Arrival date & time: 02/27/24  1102     Patient presents with: Weakness   William Gordon is a 48 y.o. male.    Weakness Patient presents with weakness and or itchiness.  Does have history of hepatitis B.  Has had cirrhosis due to it.  Had been on antivirals but [place] from [place].  Started back on.  Recent hospital admission discharge 5 days ago.  Bilirubin had been high and was around 20 on discharge.  Had been seen by GI since then had been up to 23.  States he is particular just fatigued.  No fevers or chills.  No vomiting.     Prior to Admission medications   Medication Sig Start Date End Date Taking? Authorizing Provider  entecavir  (BARACLUDE ) 1 MG tablet Take 1 tablet (1 mg total) by mouth daily. 02/22/24 05/22/24  Perri DELENA Meliton Mickey., MD  furosemide  (LASIX ) 20 MG tablet Take 1 tablet (20 mg total) by mouth daily. Follow with your outpatient doctor for repeat labs within 2 weeks to follow up your kidney function and electrolytes. 02/21/24 02/20/25  Perri DELENA Meliton Mickey., MD  ibuprofen (ADVIL) 200 MG tablet Take 200-400 mg by mouth every 6 (six) hours as needed (pain, headache).    [provider]  lactulose  (CHRONULAC ) 10 GM/15ML solution Take 30 mLs (20 g total) by mouth daily. 02/21/24   Perri DELENA Meliton Mickey., MD  OVER THE COUNTER MEDICATION Take 1 tablet by mouth daily. Tauroursodeoxycholic Acid (TUDCA), dosage unknown    [provider]  pantoprazole  (PROTONIX ) 40 MG tablet Take 1 tablet (40 mg total) by mouth daily. 02/21/24 02/20/25  Perri DELENA Meliton Mickey., MD  potassium chloride  (KLOR-CON  M) 10 MEQ tablet Take 1 tablet (10 mEq total) by mouth daily. Follow repeat labs with your outpatient doctor within 2 weeks 02/21/24 03/22/24  Perri DELENA Meliton Mickey., MD    Allergies: Lisinopril and Pork-derived products    Review of Systems  Neurological:  Positive for  weakness.    Updated Vital Signs BP 124/71 (BP Location: Right Arm)   Pulse 79   Temp 98.2 F (36.8 C) (Oral)   Resp (!) 21   Ht 6' 4 (1.93 m)   SpO2 100%   BMI 40.68 kg/m   Physical Exam Vitals and nursing note reviewed.  Eyes:     General: Scleral icterus present.  Cardiovascular:     Rate and Rhythm: Regular rhythm.  Pulmonary:     Breath sounds: No wheezing or rhonchi.  Abdominal:     Tenderness: There is no abdominal tenderness.  Musculoskeletal:     Right lower leg: Edema present.     Left lower leg: Edema present.  Skin:    Capillary Refill: Capillary refill takes less than 2 seconds.  Neurological:     Mental Status: He is alert.     (all labs ordered are listed, but only abnormal results are displayed) Labs Reviewed  COMPREHENSIVE METABOLIC PANEL WITH GFR - Abnormal; Notable for the following components:      Result Value   CO2 20 (*)    Glucose, Bld 142 (*)    Calcium 8.8 (*)    Albumin 1.6 (*)    AST 238 (*)    ALT 118 (*)    Alkaline Phosphatase 179 (*)    Total Bilirubin 25.2 (*)    All other components within normal  limits  CBC - Abnormal; Notable for the following components:   RDW 20.5 (*)    All other components within normal limits  URINALYSIS, ROUTINE W REFLEX MICROSCOPIC - Abnormal; Notable for the following components:   Color, Urine AMBER (*)    Glucose, UA >=500 (*)    Hgb urine dipstick TRACE (*)    Bilirubin Urine LARGE (*)    Protein, ur 100 (*)    Leukocytes,Ua TRACE (*)    All other components within normal limits  AMMONIA - Abnormal; Notable for the following components:   Ammonia 48 (*)    All other components within normal limits  PROTIME-INR - Abnormal; Notable for the following components:   Prothrombin Time 19.0 (*)    INR 1.5 (*)    All other components within normal limits  URINALYSIS, MICROSCOPIC (REFLEX) - Abnormal; Notable for the following components:   Bacteria, UA MANY (*)    Crystals BILIRUBIN CRYSTALS  PRESENT (*)    All other components within normal limits  LIPASE, BLOOD    EKG: None  Radiology: No results found.   Procedures   Medications Ordered in the ED - No data to display                                  Medical Decision Making Amount and/or Complexity of Data Reviewed Labs: ordered.   Patient with cirrhosis.  Worsening bilirubin.  Itchiness.  Differential diagnosis does include decompensation of his liver.  Have discussed with Dr. Dianna from GI who is familiar with the patient.  Potential outpatient follow-up versus admission to monitor lab work.  I reviewed discharge notes.  Waiting on INR and ammonia.  INR is also mildly increasing.  Ammonia elevated but lower than it was with recent beginning of admission.  With worsening fatigue worsening itching I think patient benefit from admission to the hospital.  Will discuss with hospitalist     Final diagnoses:  Liver failure without hepatic coma, unspecified chronicity [facility])    ED Discharge Orders     None          Patsey Lot, MD 02/27/24 2006

## 2024-02-27 NOTE — Progress Notes (Signed)
 EMR review for SBAR/report information; no RN to RN report received.

## 2024-02-28 ENCOUNTER — Encounter (HOSPITAL_COMMUNITY): Payer: Self-pay | Admitting: Internal Medicine

## 2024-02-28 DIAGNOSIS — B181 Chronic viral hepatitis B without delta-agent: Secondary | ICD-10-CM | POA: Diagnosis not present

## 2024-02-28 DIAGNOSIS — K7460 Unspecified cirrhosis of liver: Secondary | ICD-10-CM | POA: Diagnosis not present

## 2024-02-28 DIAGNOSIS — K7290 Hepatic failure, unspecified without coma: Secondary | ICD-10-CM | POA: Diagnosis not present

## 2024-02-28 LAB — COMPREHENSIVE METABOLIC PANEL WITH GFR
ALT: 105 U/L — ABNORMAL HIGH (ref 0–44)
AST: 208 U/L — ABNORMAL HIGH (ref 15–41)
Albumin: <1.5 g/dL — ABNORMAL LOW (ref 3.5–5.0)
Alkaline Phosphatase: 168 U/L — ABNORMAL HIGH (ref 38–126)
Anion gap: 9 (ref 5–15)
BUN: 11 mg/dL (ref 6–20)
CO2: 24 mmol/L (ref 22–32)
Calcium: 8.6 mg/dL — ABNORMAL LOW (ref 8.9–10.3)
Chloride: 102 mmol/L (ref 98–111)
Creatinine, Ser: 1.06 mg/dL (ref 0.61–1.24)
GFR, Estimated: >60 mL/min (ref >60)
Glucose, Bld: 102 mg/dL — ABNORMAL HIGH (ref 70–99)
Potassium: 3.5 mmol/L (ref 3.5–5.1)
Sodium: 135 mmol/L (ref 135–145)
Total Bilirubin: 22.7 mg/dL (ref 0.0–1.2)
Total Protein: 6.7 g/dL (ref 6.5–8.1)

## 2024-02-28 LAB — PROTIME-INR
INR: 1.5 — ABNORMAL HIGH (ref 0.8–1.2)
Prothrombin Time: 18.8 s — ABNORMAL HIGH (ref 11.4–15.2)

## 2024-02-28 MED ORDER — DIPHENHYDRAMINE-ZINC ACETATE 2-0.1 % EX CREA
TOPICAL_CREAM | Freq: Three times a day (TID) | CUTANEOUS | Status: DC | PRN
  Filled 2024-02-28: qty 28

## 2024-02-28 MED ORDER — DIPHENHYDRAMINE HCL 25 MG PO CAPS
25.0000 mg | ORAL_CAPSULE | Freq: Four times a day (QID) | ORAL | Status: DC | PRN
  Administered 2024-02-28 – 2024-02-29 (×4): 25 mg via ORAL
  Filled 2024-02-28 (×5): qty 1

## 2024-02-28 MED ORDER — POLYETHYLENE GLYCOL 3350 17 G PO PACK
17.0000 g | PACK | Freq: Every day | ORAL | Status: DC
  Administered 2024-02-28: 17 g via ORAL
  Filled 2024-02-28 (×2): qty 1

## 2024-02-28 NOTE — Plan of Care (Signed)

## 2024-02-28 NOTE — Plan of Care (Signed)

## 2024-02-28 NOTE — Progress Notes (Signed)
 PROGRESS NOTE    William Gordon  FMW:[id number] DOB: 23-Oct-1975 DOA: 02/27/2024 PCP: Patient, No Pcp Per   Brief Narrative:   48 y.o. male with medical history significant of chronic hepatitis B, hypertension, obesity.  Recently admitted 9/4-9/9 for jaundice, pruritus, generalized weakness, fatigue, nausea/vomiting in the setting of returning from [place] on the day of admission.  He was treated for malaria there.  Patient was admitted for jaundice and abnormal LFTs.  He was diagnosed with decompensated cirrhosis in the setting of chronic hepatitis B likely exacerbated by recent hepatotoxic meds given to him for presumed malaria and noncompliance with his tenofovir  for about 2-3 months. GI consulted and liver enzymes are being closely monitored.  Assessment & Plan:  Principal Problem:   Decompensated cirrhosis (HCC) Active Problems:   GERD (gastroesophageal reflux disease)   Chronic hepatitis B (HCC)   Bilateral lower extremity edema   Asymptomatic bacteriuria   Acute decompensated cirrhosis in the setting of chronic hepatitis B,POA:   No signs of hepatic encephalopathy.   GI consulted Continue to monitor LFTs, PT/INR.   Continue Entecavir , lactulose , and Benadryl  PRN itching. Total bilirubin in 22.7 today down from 25. AST and ALT are slightly decreased as well.   Bilateral lower extremity edema,POA: Lungs clear on exam and recent echo done a week ago showing normal EF.  Continue oral Lasix .   GERD Continue Protonix .   Asymptomatic bacteriuria UA with negative nitrite, trace leukocytes, 0-5 WBCs, and many bacteria.  Patient is not endorsing any urinary symptoms.  No fever or leukocytosis.  No indication for antibiotics at this time.  Disposition: Lives at home with his wife and is IADL.   DVT prophylaxis: SCDs    DVT prophylaxis: SCDs Start: 02/27/24 2235     Code Status: Full Code Family Communication:  Wife at the bedside Status is: Observation The patient remains  OBS appropriate and will d/c before 2 midnights.    Subjective:  Complaining of generalized itching. His wife was present at the bedside. He told me that he was in [place] for almost 6 months and recently came back to [place] . He was out of his entecavir  for about 2-3 months because he lost his luggage. He also suffered from malaria and was treated with acetaminophen , amoxicillin and some other medications (He doesn't remember the names).  Examination:  General exam: Appears calm and comfortable, scleral icterus present Respiratory system: Clear to auscultation. Respiratory effort normal. Cardiovascular system: S1 & S2 heard, RRR. No JVD, murmurs, rubs, gallops or clicks. No pedal edema. Gastrointestinal system: Abdomen is nondistended, soft and nontender. No organomegaly or masses felt. Normal bowel sounds heard. Central nervous system: Alert and oriented. No focal neurological deficits. Extremities: Symmetric 5 x 5 power. Skin: No rashes, lesions or ulcers Psychiatry: Judgement and insight appear normal. Mood & affect appropriate.     Diet Orders (From admission, onward)     Start     Ordered   02/27/24 2235  Diet Heart Room service appropriate? Yes; Fluid consistency: Thin  Diet effective now       Question Answer Comment  Room service appropriate? Yes   Fluid consistency: Thin      02/27/24 2236            Objective: Vitals:   02/27/24 2100 02/28/24 0109 02/28/24 0123 02/28/24 0350  BP: 122/68 123/73  124/74  Pulse: 68 71  67  Resp: 18 18  18   Temp:  98.7 F (37.1 C)  98.4 F (36.9 C)  TempSrc:  Oral  Oral  SpO2: 100% 100%  99%  Weight:   (!) 151.6 kg   Height:   6' 4 (1.93 m)    No intake or output data in the 24 hours ending 02/28/24 0913 Filed Weights   02/28/24 0123  Weight: (!) 151.6 kg    Scheduled Meds:  entecavir   1 mg Oral Daily   furosemide   20 mg Oral Daily   lactulose   20 g Oral Daily   pantoprazole   40 mg Oral Daily   polyethylene glycol  17  g Oral Daily   Continuous Infusions:  Nutritional status     Body mass index is 40.68 kg/m.  Data Reviewed:   CBC: Recent Labs  Lab 02/27/24 1235  WBC 7.8  HGB 13.1  HCT 41.7  MCV 94.1  PLT 209   Basic Metabolic Panel: Recent Labs  Lab 02/27/24 1235 02/28/24 0536  NA 135 135  K 3.9 3.5  CL 105 102  CO2 20* 24  GLUCOSE 142* 102*  BUN 12 11  CREATININE 1.18 1.06  CALCIUM 8.8* 8.6*   GFR: Estimated Creatinine Clearance: 135.9 mL/min (by C-G formula based on SCr of 1.06 mg/dL). Liver Function Tests: Recent Labs  Lab 02/27/24 1235 02/28/24 0536  AST 238* 208*  ALT 118* 105*  ALKPHOS 179* 168*  BILITOT 25.2* 22.7*  PROT 7.5 6.7  ALBUMIN 1.6* <1.5*   Recent Labs  Lab 02/27/24 1235  LIPASE 42   Recent Labs  Lab 02/27/24 1859  AMMONIA 48*   Coagulation Profile: Recent Labs  Lab 02/27/24 1850 02/28/24 0536  INR 1.5* 1.5*   Cardiac Enzymes: No results for input(s): CKTOTAL, CKMB, CKMBINDEX, TROPONINI in the last 168 hours. BNP (last 3 results) No results for input(s): PROBNP in the last 8760 hours. HbA1C: No results for input(s): HGBA1C in the last 72 hours. CBG: No results for input(s): GLUCAP in the last 168 hours. Lipid Profile: No results for input(s): CHOL, HDL, LDLCALC, TRIG, CHOLHDL, LDLDIRECT in the last 72 hours. Thyroid Function Tests: No results for input(s): TSH, T4TOTAL, FREET4, T3FREE, THYROIDAB in the last 72 hours. Anemia Panel: No results for input(s): VITAMINB12, FOLATE, FERRITIN, TIBC, IRON, RETICCTPCT in the last 72 hours. Sepsis Labs: No results for input(s): PROCALCITON, LATICACIDVEN in the last 168 hours.  No results found for this or any previous visit (from the past 240 hours).       Radiology Studies: No results found.         LOS: 0 days   Time spent= 35 mins    Deliliah Room, MD [facility]  If 7PM-7AM, please contact  night-coverage  02/28/2024, 9:13 AM

## 2024-02-28 NOTE — Consult Note (Signed)
 Reason for Consult: Jaundice Referring Physician: Hospital team  William Gordon is an 48 y.o. male.  HPI: Patient seen and examined and his hospital computer chart and our office computer chart were reviewed and his case discussed with his wife and other than itching and fatigue he has no other complaints and eating seems to make the itching worse and infectious disease supposedly chose his antivirals and my partner who follows him Dr. Saintclair has referred him to [facility] liver service but they have not heard yet when that appointment is and I answered all of their questions and he is eating fine and has no other complaints  History reviewed. No pertinent past medical history.  History reviewed. No pertinent surgical history.  History reviewed. No pertinent family history.  Social History:  reports that he has never smoked. He does not have any smokeless tobacco history on file. He reports that he does not drink alcohol and does not use drugs.  Allergies:  Allergies  Allergen Reactions   Lisinopril Other (See Comments)    Erectile dysfunction   Pork-Derived Products Other (See Comments)    Does not eat pork    Medications: I have reviewed the patient's current medications.  Results for orders placed or performed during the hospital encounter of 02/27/24 (from the past 48 hours)  Lipase, blood     Status: None   Collection Time: 02/27/24 12:35 PM  Result Value Ref Range   Lipase 42 11 - 51 U/L    Comment: Performed at [facility], [address] [place], [place] [address]  Comprehensive metabolic panel     Status: Abnormal   Collection Time: 02/27/24 12:35 PM  Result Value Ref Range   Sodium 135 135 - 145 mmol/L   Potassium 3.9 3.5 - 5.1 mmol/L   Chloride 105 98 - 111 mmol/L   CO2 20 (L) 22 - 32 mmol/L   Glucose, Bld 142 (H) 70 - 99 mg/dL    Comment: Glucose reference range applies only to samples taken after fasting for at least 8 hours.   BUN 12 6 - 20 mg/dL    Creatinine, Ser 8.81 0.61 - 1.24 mg/dL    Comment: ICTERUS AT THIS LEVEL MAY AFFECT RESULT   Calcium 8.8 (L) 8.9 - 10.3 mg/dL   Total Protein 7.5 6.5 - 8.1 g/dL   Albumin 1.6 (L) 3.5 - 5.0 g/dL   AST 761 (H) 15 - 41 U/L   ALT 118 (H) 0 - 44 U/L   Alkaline Phosphatase 179 (H) 38 - 126 U/L   Total Bilirubin 25.2 (HH) 0.0 - 1.2 mg/dL    Comment: CRITICAL RESULT CALLED TO, READ BACK BY AND VERIFIED WITH CRYSTAL BLACK RN.@[id number]  ON 9.15.25 BY TCALDWELL MLS.   GFR, Estimated >60 >60 mL/min    Comment: (NOTE) Calculated using the CKD-EPI Creatinine Equation (2021)    Anion gap 10 5 - 15    Comment: Performed at [facility], [address], [place], [place] [address]  CBC     Status: Abnormal   Collection Time: 02/27/24 12:35 PM  Result Value Ref Range   WBC 7.8 4.0 - 10.5 K/uL   RBC 4.43 4.22 - 5.81 MIL/uL   Hemoglobin 13.1 13.0 - 17.0 g/dL   HCT 58.2 60.9 - 47.9 %   MCV 94.1 80.0 - 100.0 fL   MCH 29.6 26.0 - 34.0 pg   MCHC 31.4 30.0 - 36.0 g/dL   RDW 79.4 (H) 88.4 - 84.4 %  Platelets 209 150 - 400 K/uL   nRBC 0.0 0.0 - 0.2 %    Comment: Performed at [facility], [address], [place], [place] [address]  Urinalysis, Routine w reflex microscopic -Urine, Clean Catch     Status: Abnormal   Collection Time: 02/27/24  6:31 PM  Result Value Ref Range   Color, Urine AMBER (A) YELLOW    Comment: BIOCHEMICALS MAY BE AFFECTED BY COLOR   APPearance CLEAR CLEAR   Specific Gravity, Urine 1.020 1.005 - 1.030   pH 6.5 5.0 - 8.0   Glucose, UA >=500 (A) NEGATIVE mg/dL   Hgb urine dipstick TRACE (A) NEGATIVE   Bilirubin Urine LARGE (A) NEGATIVE   Ketones, ur NEGATIVE NEGATIVE mg/dL   Protein, ur 899 (A) NEGATIVE mg/dL   Nitrite NEGATIVE NEGATIVE   Leukocytes,Ua TRACE (A) NEGATIVE    Comment: Performed at [facility], [address] [place], [place] [address]  Urinalysis, Microscopic (reflex)     Status: Abnormal   Collection Time: 02/27/24  6:31 PM  Result Value Ref  Range   RBC / HPF 0-5 0 - 5 RBC/hpf   WBC, UA 0-5 0 - 5 WBC/hpf   Bacteria, UA MANY (A) NONE SEEN   Squamous Epithelial / HPF 0-5 0 - 5 /HPF   Mucus PRESENT    Amorphous Crystal PRESENT    Crystals BILIRUBIN CRYSTALS PRESENT (A) NEGATIVE    Comment: Performed at [facility], [address], [place], [place] [address]  Protime-INR     Status: Abnormal   Collection Time: 02/27/24  6:50 PM  Result Value Ref Range   Prothrombin Time 19.0 (H) 11.4 - 15.2 seconds   INR 1.5 (H) 0.8 - 1.2    Comment: (NOTE) INR goal varies based on device and disease states. Performed at [facility], [address], [place], [place] [address]   Ammonia     Status: Abnormal   Collection Time: 02/27/24  6:59 PM  Result Value Ref Range   Ammonia 48 (H) 9 - 35 umol/L    Comment: Performed at [facility], [address] [place], [place] [address]  Comprehensive metabolic panel     Status: Abnormal   Collection Time: 02/28/24  5:36 AM  Result Value Ref Range   Sodium 135 135 - 145 mmol/L   Potassium 3.5 3.5 - 5.1 mmol/L   Chloride 102 98 - 111 mmol/L   CO2 24 22 - 32 mmol/L   Glucose, Bld 102 (H) 70 - 99 mg/dL    Comment: Glucose reference range applies only to samples taken after fasting for at least 8 hours.   BUN 11 6 - 20 mg/dL   Creatinine, Ser 8.93 0.61 - 1.24 mg/dL    Comment: ICTERUS AT THIS LEVEL MAY AFFECT RESULT   Calcium 8.6 (L) 8.9 - 10.3 mg/dL   Total Protein 6.7 6.5 - 8.1 g/dL   Albumin <8.4 (L) 3.5 - 5.0 g/dL   AST 791 (H) 15 - 41 U/L   ALT 105 (H) 0 - 44 U/L   Alkaline Phosphatase 168 (H) 38 - 126 U/L   Total Bilirubin 22.7 (HH) 0.0 - 1.2 mg/dL    Comment: CRITICAL RESULT CALLED TO, READ BACK BY AND VERIFIED WITH MICHERLLE M, RN 02/28/24 MAULES   GFR, Estimated >60 >60 mL/min    Comment: (NOTE) Calculated using the CKD-EPI Creatinine Equation (2021)    Anion gap 9 5 - 15  Comment: Performed at [facility], [address], [place], [place] [address]   Protime-INR     Status: Abnormal   Collection Time: 02/28/24  5:36 AM  Result Value Ref Range   Prothrombin Time 18.8 (H) 11.4 - 15.2 seconds   INR 1.5 (H) 0.8 - 1.2    Comment: (NOTE) INR goal varies based on device and disease states. Performed at [facility], [address] [place], [place] [address]     No results found.  ROS negative except above Blood pressure 124/74, pulse 67, temperature 98.4 F (36.9 C), temperature source Oral, resp. rate 18, height 6' 4 (1.93 m), weight (!) 151.6 kg, SpO2 99%. Physical Exam vital signs stable afebrile no acute distress abdomen is soft nontender labs reviewed ultrasound reviewed LFTs overall about the same CBC okay INR about the same  Assessment/Plan: Acute hep B with significant jaundice Plan: Would ask infectious disease to make sure he is on the correct medicine and he can follow-up with his primary gastroenterologist Dr. Saintclair if needed prior to his [facility] appointment otherwise I am not sure he needs to be in the hospital and please call me if I could be of any further assistance with this hospital stay and might consider vitamin K   Keliah Harned E 02/28/2024, 10:44 AM

## 2024-02-29 ENCOUNTER — Other Ambulatory Visit (HOSPITAL_COMMUNITY): Payer: Self-pay

## 2024-02-29 DIAGNOSIS — K7460 Unspecified cirrhosis of liver: Secondary | ICD-10-CM | POA: Diagnosis not present

## 2024-02-29 DIAGNOSIS — K7290 Hepatic failure, unspecified without coma: Secondary | ICD-10-CM | POA: Diagnosis not present

## 2024-02-29 LAB — HEPATIC FUNCTION PANEL
ALT: 138 U/L — ABNORMAL HIGH (ref 0–44)
AST: 284 U/L — ABNORMAL HIGH (ref 15–41)
Albumin: 1.7 g/dL — ABNORMAL LOW (ref 3.5–5.0)
Alkaline Phosphatase: 215 U/L — ABNORMAL HIGH (ref 38–126)
Bilirubin, Direct: 15.2 mg/dL — ABNORMAL HIGH (ref 0.0–0.2)
Indirect Bilirubin: 14.5 mg/dL — ABNORMAL HIGH (ref 0.3–0.9)
Total Bilirubin: 29.7 mg/dL (ref 0.0–1.2)
Total Protein: 8.6 g/dL — ABNORMAL HIGH (ref 6.5–8.1)

## 2024-02-29 MED ORDER — DIPHENHYDRAMINE HCL 25 MG PO TABS
25.0000 mg | ORAL_TABLET | Freq: Three times a day (TID) | ORAL | 0 refills | Status: AC | PRN
  Filled 2024-02-29: qty 60, 20d supply, fill #0

## 2024-02-29 MED ORDER — POLYETHYLENE GLYCOL 3350 17 GM/SCOOP PO POWD
17.0000 g | Freq: Every day | ORAL | 0 refills | Status: AC
  Filled 2024-02-29: qty 238, 14d supply, fill #0

## 2024-02-29 MED ORDER — POLYETHYLENE GLYCOL 3350 17 G PO PACK: 17.0000 g | PACK | Freq: Every day | ORAL | 0 refills | Status: DC

## 2024-02-29 MED ORDER — DIPHENHYDRAMINE HCL 50 MG PO TABS: 25.0000 mg | ORAL_TABLET | Freq: Three times a day (TID) | ORAL | 0 refills | Status: AC | PRN

## 2024-02-29 MED ORDER — ENTECAVIR 0.5 MG PO TABS
1.0000 mg | ORAL_TABLET | Freq: Every day | ORAL | 0 refills | Status: AC
  Filled 2024-02-29 – 2024-03-01 (×2): qty 10, 5d supply, fill #0
  Filled 2024-03-01: qty 50, 25d supply, fill #0

## 2024-02-29 NOTE — Discharge Summary (Signed)
 Physician Discharge Summary   Patient: William Gordon MRN: [id number] DOB: [birth year]  Admit date:     02/27/2024  Discharge date: 02/29/24  Discharge Physician: Drue ONEIDA Potter   PCP: Patient, No Pcp Per   Recommendations at discharge:  Followed up with GI  Discharge Diagnoses: Principal Problem:   Decompensated cirrhosis (HCC) Active Problems:   GERD (gastroesophageal reflux disease)   Chronic hepatitis B (HCC)   Bilateral lower extremity edema   Asymptomatic bacteriuria  Resolved Problems:   * No resolved hospital problems. *  Hospital Course: 48 y.o. male with medical history significant of chronic hepatitis B, hypertension, obesity.  Recently admitted 9/4-9/9 for jaundice, pruritus, generalized weakness, fatigue, nausea/vomiting in the setting of returning from [place] on the day of admission.  He was treated for malaria there.  Patient was admitted for jaundice and abnormal LFTs.  He was diagnosed with decompensated cirrhosis in the setting of chronic hepatitis B likely exacerbated by recent hepatotoxic meds given to him for presumed malaria and noncompliance with his tenofovir  for about 2-3 months. GI consulted and liver enzymes are being closely monitored.  I discussed with infectious disease Dr. Loney Stank who recommends continuation of current Entecavir  and for patient to follow-up with her gastroenterologist as an outpatient  Consultants: Gastroenterology, infectious disease  Procedures performed: home  Disposition: Home  Diet recommendation:  Cardiac diet DISCHARGE MEDICATION: Allergies as of 02/29/2024       Reactions   Lisinopril Other (See Comments)   Erectile dysfunction   Pork-derived Products Other (See Comments)   Does not eat pork        Medication List     STOP taking these medications    ibuprofen 200 MG tablet Commonly known as: ADVIL       TAKE these medications    Constulose  10 GM/15ML solution Generic drug: lactulose  Take 30 mLs  (20 g total) by mouth daily.   diphenhydrAMINE  50 MG tablet Commonly known as: BENADRYL  Take 0.5 tablets (25 mg total) by mouth every 8 (eight) hours as needed for itching.   entecavir  1 MG tablet Commonly known as: BARACLUDE  Take 1 tablet (1 mg total) by mouth daily.   furosemide  20 MG tablet Commonly known as: Lasix  Take 1 tablet (20 mg total) by mouth daily. Follow with your outpatient doctor for repeat labs within 2 weeks to follow up your kidney function and electrolytes.   pantoprazole  40 MG tablet Commonly known as: Protonix  Take 1 tablet (40 mg total) by mouth daily.   polyethylene glycol 17 g packet Commonly known as: MIRALAX  / GLYCOLAX  Take 17 g by mouth daily. Start taking on: March 01, 2024   potassium chloride  10 MEQ tablet Commonly known as: KLOR-CON  M Take 1 tablet (10 mEq total) by mouth daily. Follow repeat labs with your outpatient doctor within 2 weeks        Follow-up Information     Stank Loney, MD. Call.   Specialty: Infectious Diseases Contact information: [address] [place] [address] [phone]         Saintclair Jasper, MD Follow up.   Specialty: Gastroenterology Contact information: [address] [place] [address] [phone]         Maryla Knoll, FNP. Schedule an appointment as soon as possible for a visit.   Specialty: Nurse Practitioner Why: Call the office and schedule a hospital follow up in the next 7-10 days. Contact information: [id number] [employer]  [place] [address] [phone]                Discharge Exam: Fredricka Weights   02/28/24 0123  Weight: (!) 151.6 kg   General exam: Appears calm and comfortable, scleral icterus present Respiratory system: Clear to auscultation. Respiratory effort normal. Cardiovascular system: S1 & S2 heard, RRR. No JVD, murmurs, rubs, gallops or clicks. No pedal edema. Gastrointestinal system: Abdomen is nondistended,  soft and nontender. No organomegaly or masses felt. Normal bowel sounds heard. Central nervous system: Alert and oriented. No focal neurological deficits. Extremities: Symmetric 5 x 5 power. Skin: No rashes, lesions or ulcers Psychiatry: Judgement and insight appear normal. Mood & affect appropriate.  Condition at discharge: good  The results of significant diagnostics from this hospitalization (including imaging, microbiology, ancillary and laboratory) are listed below for reference.   Imaging Studies: ECHOCARDIOGRAM COMPLETE Result Date: 02/20/2024    ECHOCARDIOGRAM REPORT   Patient Name:   William Gordon Date of Exam: 02/20/2024 Medical Rec #:  [id number]        Height:       76.0 in Accession #:    [id number]       Weight:       334.2 lb Date of Birth:  Sep 02, 1975         BSA:          2.755 m Patient Age:    48 years         BP:           115/75 mmHg Patient Gender: M                HR:           65 bpm. Exam Location:  Inpatient Procedure: 2D Echo, Cardiac Doppler and Color Doppler (Both Spectral and Color            Flow Doppler were utilized during procedure). Indications:    Other abnormalities of the heart R00.8  History:        Patient has no prior history of Echocardiogram examinations.                 Risk Factors:Hypertension.  Sonographer:    Jayson Gaskins Referring Phys: [phone] A CALDWELL POWELL JR IMPRESSIONS  1. Left ventricular ejection fraction, by estimation, is 60 to 65%. The left ventricle has normal function. The left ventricle has no regional wall motion abnormalities. Left ventricular diastolic parameters were normal.  2. Right ventricular systolic function is normal. The right ventricular size is normal.  3. The mitral valve is normal in structure. No evidence of mitral valve regurgitation. No evidence of mitral stenosis.  4. The aortic valve is tricuspid. There is mild calcification of the aortic valve. Aortic valve regurgitation is not visualized. Aortic valve sclerosis is  present, with no evidence of aortic valve stenosis.  5. The inferior vena cava is normal in size with greater than 50% respiratory variability, suggesting right atrial pressure of 3 mmHg. FINDINGS  Left Ventricle: Left ventricular ejection fraction, by estimation, is 60 to 65%. The left ventricle has normal function. The left ventricle has no regional wall motion abnormalities. Strain was performed and the global longitudinal strain is indeterminate. The left ventricular internal cavity size was normal in size. There is no left ventricular hypertrophy. Left ventricular diastolic parameters were normal. Right Ventricle: The right ventricular size is normal. No increase in right ventricular wall thickness. Right ventricular systolic function is normal. Left Atrium: Left atrial size was normal  in size. Right Atrium: Right atrial size was normal in size. Pericardium: There is no evidence of pericardial effusion. Mitral Valve: The mitral valve is normal in structure. No evidence of mitral valve regurgitation. No evidence of mitral valve stenosis. Tricuspid Valve: The tricuspid valve is normal in structure. Tricuspid valve regurgitation is not demonstrated. No evidence of tricuspid stenosis. Aortic Valve: The aortic valve is tricuspid. There is mild calcification of the aortic valve. Aortic valve regurgitation is not visualized. Aortic valve sclerosis is present, with no evidence of aortic valve stenosis. Aortic valve mean gradient measures 3.0 mmHg. Aortic valve peak gradient measures 6.0 mmHg. Aortic valve area, by VTI measures 3.57 cm. Pulmonic Valve: The pulmonic valve was normal in structure. Pulmonic valve regurgitation is trivial. No evidence of pulmonic stenosis. Aorta: The aortic root is normal in size and structure. Venous: The inferior vena cava is normal in size with greater than 50% respiratory variability, suggesting right atrial pressure of 3 mmHg. IAS/Shunts: The interatrial septum was not well  visualized. Additional Comments: 3D was performed not requiring image post processing on an independent workstation and was indeterminate.  LEFT VENTRICLE PLAX 2D LVIDd:         5.70 cm   Diastology LVIDs:         3.70 cm   LV e' medial:    7.07 cm/s LV PW:         0.90 cm   LV E/e' medial:  8.7 LV IVS:        1.00 cm   LV e' lateral:   8.70 cm/s LVOT diam:     2.00 cm   LV E/e' lateral: 7.0 LV SV:         81 LV SV Index:   29 LVOT Area:     3.14 cm  RIGHT VENTRICLE RV S prime:     21.60 cm/s TAPSE (M-mode): 3.8 cm LEFT ATRIUM             Index        RIGHT ATRIUM           Index LA Vol (A2C):   58.7 ml 21.30 ml/m  RA Area:     27.00 cm LA Vol (A4C):   55.8 ml 20.25 ml/m  RA Volume:   99.00 ml  35.93 ml/m LA Biplane Vol: 57.0 ml 20.69 ml/m  AORTIC VALVE AV Area (Vmax):    3.19 cm AV Area (Vmean):   3.26 cm AV Area (VTI):     3.57 cm AV Vmax:           122.00 cm/s AV Vmean:          88.500 cm/s AV VTI:            0.227 m AV Peak Grad:      6.0 mmHg AV Mean Grad:      3.0 mmHg LVOT Vmax:         124.00 cm/s LVOT Vmean:        91.700 cm/s LVOT VTI:          0.258 m LVOT/AV VTI ratio: 1.14  AORTA Ao Root diam: 3.10 cm MITRAL VALVE MV Area (PHT): 3.10 cm    SHUNTS MV Decel Time: 245 msec    Systemic VTI:  0.26 m MV E velocity: 61.30 cm/s  Systemic Diam: 2.00 cm MV A velocity: 79.70 cm/s MV E/A ratio:  0.77 Maude Emmer MD Electronically signed by Maude Emmer MD Signature Date/Time: 02/20/2024/2:17:31 PM  Final    VAS US  LOWER EXTREMITY VENOUS (DVT) Result Date: 02/20/2024  Lower Venous DVT Study Patient Name:  CASANOVA SCHURMAN  Date of Exam:   02/20/2024 Medical Rec #: [id number]         Accession #:    [id number] Date of Birth: 03/27/1976          Patient Gender: M Patient Age:   72 years Exam Location[facility]  [facility] Procedure:      VAS US  LOWER EXTREMITY VENOUS (DVT) Referring Phys: A POWELL JR --------------------------------------------------------------------------------  Indications: Edema.   Comparison Study: No prior exam. Performing Technologist: Edilia Elden Appl  Examination Guidelines: A complete evaluation includes B-mode imaging, spectral Doppler, color Doppler, and power Doppler as needed of all accessible portions of each vessel. Bilateral testing is considered an integral part of a complete examination. Limited examinations for reoccurring indications may be performed as noted. The reflux portion of the exam is performed with the patient in reverse Trendelenburg.  +---------+---------------+---------+-----------+----------+--------------+ RIGHT    CompressibilityPhasicitySpontaneityPropertiesThrombus Aging +---------+---------------+---------+-----------+----------+--------------+ CFV      Full           Yes      Yes                                 +---------+---------------+---------+-----------+----------+--------------+ SFJ      Full           Yes      Yes                                 +---------+---------------+---------+-----------+----------+--------------+ FV Prox  Full                                                        +---------+---------------+---------+-----------+----------+--------------+ FV Mid   Full                                                        +---------+---------------+---------+-----------+----------+--------------+ FV DistalFull                                                        +---------+---------------+---------+-----------+----------+--------------+ PFV      Full                                                        +---------+---------------+---------+-----------+----------+--------------+ POP      Full           Yes      Yes                                 +---------+---------------+---------+-----------+----------+--------------+ PTV      Full                                                        +---------+---------------+---------+-----------+----------+--------------+  PERO      Full                                                        +---------+---------------+---------+-----------+----------+--------------+ Patient concerned of bump on his middle anterior medial thigh, solid structure with no vascularity measuring 0.86 x 0.35 x 0.49 cm.  +---------+---------------+---------+-----------+----------+--------------+ LEFT     CompressibilityPhasicitySpontaneityPropertiesThrombus Aging +---------+---------------+---------+-----------+----------+--------------+ CFV      Full           Yes      Yes                                 +---------+---------------+---------+-----------+----------+--------------+ SFJ      Full           Yes      Yes                                 +---------+---------------+---------+-----------+----------+--------------+ FV Prox  Full                                                        +---------+---------------+---------+-----------+----------+--------------+ FV Mid   Full                                                        +---------+---------------+---------+-----------+----------+--------------+ FV DistalFull                                                        +---------+---------------+---------+-----------+----------+--------------+ PFV      Full                                                        +---------+---------------+---------+-----------+----------+--------------+ POP      Full           Yes      Yes                                 +---------+---------------+---------+-----------+----------+--------------+ PTV      Full                                                        +---------+---------------+---------+-----------+----------+--------------+ PERO     Full                                                        +---------+---------------+---------+-----------+----------+--------------+  Summary: BILATERAL: - No evidence of deep vein thrombosis seen in the  lower extremities, bilaterally. -No evidence of popliteal cyst, bilaterally. RIGHT: - Avascular solid structure noted on right middle thigh.   *See table(s) above for measurements and observations. Electronically signed by Gaile New MD on 02/20/2024 at 1:43:59 PM.    Final    US  Abdomen Limited RUQ (LIVER/GB) Result Date: 02/16/2024 CLINICAL DATA:  Bilirubinemia EXAM: ULTRASOUND ABDOMEN LIMITED RIGHT UPPER QUADRANT COMPARISON:  Ultrasound 05/22/2022.  MRI 06/06/2022 FINDINGS: Gallbladder: Gallbladder wall is thickened at 5 mm. Gallbladder is mildly contracted. No visible stones or sonographic Murphy sign. Common bile duct: Diameter: Normal caliber, 1 mm Liver: Shrunken liver with nodular contour suggesting cirrhosis. No focal hepatic abnormality. Portal vein is patent on color Doppler imaging with normal direction of blood flow towards the liver. Other: None. IMPRESSION: Liver appears shrunken with nodular contour suggesting cirrhosis. Gallbladder is contracted without visible stones. Mild wall thickening could be related to contracted state or liver disease. Electronically Signed   By: Franky Crease M.D.   On: 02/16/2024 12:19   DG Chest 2 View Result Date: 02/16/2024 CLINICAL DATA:  Fevers, recent return from less [place]. EXAM: CHEST - 2 VIEW COMPARISON:  01/31/2007 FINDINGS: Normal mediastinum and cardiac silhouette. Normal pulmonary vasculature. No evidence of effusion, infiltrate, or pneumothorax. No acute bony abnormality. IMPRESSION: No acute cardiopulmonary process. Electronically Signed   By: Jackquline Boxer M.D.   On: 02/16/2024 10:32    Microbiology: Results for orders placed or performed during the hospital encounter of 02/16/24  Blood culture (routine x 2)     Status: None   Collection Time: 02/16/24 12:40 PM   Specimen: BLOOD  Result Value Ref Range Status   Specimen Description BLOOD SITE NOT SPECIFIED  Final   Special Requests   Final    BOTTLES DRAWN AEROBIC AND ANAEROBIC Blood  Culture adequate volume   Culture   Final    NO GROWTH 5 DAYS Performed at [facility], [address] [place], [place] [address]    Report Status 02/21/2024 FINAL  Final  Blood culture (routine x 2)     Status: None   Collection Time: 02/16/24  4:57 PM   Specimen: BLOOD LEFT ARM  Result Value Ref Range Status   Specimen Description BLOOD LEFT ARM  Final   Special Requests   Final    BOTTLES DRAWN AEROBIC ONLY Blood Culture results may not be optimal due to an inadequate volume of blood received in culture bottles   Culture   Final    NO GROWTH 5 DAYS Performed at [facility], [address], [place], [place] [address]    Report Status 02/21/2024 FINAL  Final    Labs: CBC: Recent Labs  Lab 02/27/24 1235  WBC 7.8  HGB 13.1  HCT 41.7  MCV 94.1  PLT 209   Basic Metabolic Panel: Recent Labs  Lab 02/27/24 1235 02/28/24 0536  NA 135 135  K 3.9 3.5  CL 105 102  CO2 20* 24  GLUCOSE 142* 102*  BUN 12 11  CREATININE 1.18 1.06  CALCIUM 8.8* 8.6*   Liver Function Tests: Recent Labs  Lab 02/27/24 1235 02/28/24 0536 02/29/24 1303  AST 238* 208* 284*  ALT 118* 105* 138*  ALKPHOS 179* 168* 215*  BILITOT 25.2* 22.7* 29.7*  PROT 7.5 6.7 8.6*  ALBUMIN 1.6* <1.5* 1.7*   CBG: No results for input(s): GLUCAP in the last 168 hours.  Discharge time  spent:  36 minutes.  Signed: Drue ONEIDA Potter, MD [facility] 02/29/2024

## 2024-02-29 NOTE — Plan of Care (Signed)
  Problem: Education: Goal: Knowledge of General Education information will improve Description: Including pain rating scale, medication(s)/side effects and non-pharmacologic comfort measures 02/29/2024 1637 by Jodene Augustin Macario JINNY, RN Outcome: Adequate for Discharge 02/29/2024 1601 by Jodene Augustin Macario JINNY, RN Outcome: Progressing   Problem: Health Behavior/Discharge Planning: Goal: Ability to manage health-related needs will improve 02/29/2024 1637 by Aliannah Holstrom, Augustin Macario JINNY, RN Outcome: Adequate for Discharge 02/29/2024 1601 by Jodene Augustin Macario JINNY, RN Outcome: Progressing   Problem: Clinical Measurements: Goal: Ability to maintain clinical measurements within normal limits will improve 02/29/2024 1637 by Jodene Augustin Macario JINNY, RN Outcome: Adequate for Discharge 02/29/2024 1601 by Jodene Augustin Macario JINNY, RN Outcome: Progressing Goal: Will remain free from infection 02/29/2024 1637 by Jodene Augustin Macario JINNY, RN Outcome: Adequate for Discharge 02/29/2024 1601 by Jodene Augustin Macario JINNY, RN Outcome: Progressing Goal: Diagnostic test results will improve 02/29/2024 1637 by Jodene Augustin Macario JINNY, RN Outcome: Adequate for Discharge 02/29/2024 1601 by Jodene Augustin Macario JINNY, RN Outcome: Progressing Goal: Respiratory complications will improve 02/29/2024 1637 by Jodene Augustin Macario JINNY, RN Outcome: Adequate for Discharge 02/29/2024 1601 by Jodene Augustin Macario JINNY, RN Outcome: Progressing Goal: Cardiovascular complication will be avoided 02/29/2024 1637 by Jodene Augustin Macario JINNY, RN Outcome: Adequate for Discharge 02/29/2024 1601 by Jodene Augustin Macario JINNY, RN Outcome: Progressing   Problem: Activity: Goal: Risk for activity intolerance will decrease 02/29/2024 1637 by Jodene Augustin Macario JINNY, RN Outcome: Adequate for Discharge 02/29/2024 1601 by Jodene Augustin Macario JINNY, RN Outcome: Progressing   Problem: Nutrition: Goal: Adequate nutrition will be maintained 02/29/2024 1637 by Jodene Augustin Macario JINNY, RN Outcome:  Adequate for Discharge 02/29/2024 1601 by Jodene Augustin Macario JINNY, RN Outcome: Progressing   Problem: Coping: Goal: Level of anxiety will decrease 02/29/2024 1637 by Jodene Augustin Macario JINNY, RN Outcome: Adequate for Discharge 02/29/2024 1601 by Jodene Augustin Macario JINNY, RN Outcome: Progressing   Problem: Elimination: Goal: Will not experience complications related to bowel motility 02/29/2024 1637 by Jodene Augustin Macario JINNY, RN Outcome: Adequate for Discharge 02/29/2024 1601 by Jodene Augustin Macario JINNY, RN Outcome: Progressing Goal: Will not experience complications related to urinary retention 02/29/2024 1637 by Jodene Augustin Macario JINNY, RN Outcome: Adequate for Discharge 02/29/2024 1601 by Jodene Augustin Macario JINNY, RN Outcome: Progressing   Problem: Pain Managment: Goal: General experience of comfort will improve and/or be controlled 02/29/2024 1637 by Jodene Augustin Macario JINNY, RN Outcome: Adequate for Discharge 02/29/2024 1601 by Jodene Augustin Macario JINNY, RN Outcome: Progressing   Problem: Safety: Goal: Ability to remain free from injury will improve 02/29/2024 1637 by Jodene Augustin Macario JINNY, RN Outcome: Adequate for Discharge 02/29/2024 1601 by Jodene Augustin Macario JINNY, RN Outcome: Progressing   Problem: Skin Integrity: Goal: Risk for impaired skin integrity will decrease 02/29/2024 1637 by Jodene Augustin Macario JINNY, RN Outcome: Adequate for Discharge 02/29/2024 1601 by Jodene Augustin Macario JINNY, RN Outcome: Progressing

## 2024-02-29 NOTE — Progress Notes (Signed)
 Pt taken outside to private vehicle via wheelchair.  Discharged home in stable condition with all belongings.

## 2024-02-29 NOTE — Plan of Care (Signed)

## 2024-02-29 NOTE — Progress Notes (Signed)
 Discharge instructions completed with pt and copy given.  22g IV removed from left forearm with tip intact.  Dr. Dorinda notified of medication changes.  All questions answered.

## 2024-02-29 NOTE — Progress Notes (Signed)
 Transition of Care [facility]) - Inpatient Brief Assessment   Patient Details  Name: William Gordon MRN: [id number] Date of Birth: 19-Oct-1975  Transition of Care [facility]) CM/SW Contact:    Rosaline JONELLE Joe, RN Phone Number: 02/29/2024, 4:08 PM   Clinical Narrative: Patient admitted for Decompensated cirrhosis.  No IP Care management needs at this time.  Transition of Care Asessment: Insurance and Status: (P) Insurance coverage has been reviewed Patient has primary care physician: (P) Yes Home environment has been reviewed: (P) from home Prior level of function:: (P) self Prior/Current Home Services: (P) No current home services Social Drivers of Health Review: (P) SDOH reviewed no interventions necessary Readmission risk has been reviewed: (P) Yes Transition of care needs: (P) no transition of care needs at this time

## 2024-03-01 ENCOUNTER — Other Ambulatory Visit (HOSPITAL_COMMUNITY): Payer: Self-pay

## 2024-03-02 ENCOUNTER — Other Ambulatory Visit (HOSPITAL_COMMUNITY): Payer: Self-pay

## 2024-03-05 DIAGNOSIS — K7290 Hepatic failure, unspecified without coma: Secondary | ICD-10-CM | POA: Diagnosis not present

## 2024-03-05 DIAGNOSIS — B181 Chronic viral hepatitis B without delta-agent: Secondary | ICD-10-CM | POA: Diagnosis not present

## 2024-03-05 DIAGNOSIS — K7460 Unspecified cirrhosis of liver: Secondary | ICD-10-CM | POA: Diagnosis not present

## 2024-03-05 DIAGNOSIS — Z09 Encounter for follow-up examination after completed treatment for conditions other than malignant neoplasm: Secondary | ICD-10-CM | POA: Diagnosis not present

## 2024-03-07 DIAGNOSIS — K7460 Unspecified cirrhosis of liver: Secondary | ICD-10-CM | POA: Diagnosis not present

## 2024-03-07 DIAGNOSIS — I10 Essential (primary) hypertension: Secondary | ICD-10-CM | POA: Diagnosis not present

## 2024-03-08 DIAGNOSIS — B181 Chronic viral hepatitis B without delta-agent: Secondary | ICD-10-CM | POA: Diagnosis not present

## 2024-03-08 DIAGNOSIS — Z23 Encounter for immunization: Secondary | ICD-10-CM | POA: Diagnosis not present

## 2024-03-08 DIAGNOSIS — D649 Anemia, unspecified: Secondary | ICD-10-CM | POA: Diagnosis not present

## 2024-03-08 DIAGNOSIS — E785 Hyperlipidemia, unspecified: Secondary | ICD-10-CM | POA: Diagnosis not present

## 2024-03-08 DIAGNOSIS — M6284 Sarcopenia: Secondary | ICD-10-CM | POA: Diagnosis not present

## 2024-03-08 DIAGNOSIS — L299 Pruritus, unspecified: Secondary | ICD-10-CM | POA: Diagnosis not present

## 2024-03-08 DIAGNOSIS — Z01818 Encounter for other preprocedural examination: Secondary | ICD-10-CM | POA: Diagnosis not present

## 2024-03-08 DIAGNOSIS — K3189 Other diseases of stomach and duodenum: Secondary | ICD-10-CM | POA: Diagnosis not present

## 2024-03-08 DIAGNOSIS — Z91141 Patient's other noncompliance with medication regimen due to financial hardship: Secondary | ICD-10-CM | POA: Diagnosis not present

## 2024-03-08 DIAGNOSIS — D689 Coagulation defect, unspecified: Secondary | ICD-10-CM | POA: Diagnosis not present

## 2024-03-08 DIAGNOSIS — K766 Portal hypertension: Secondary | ICD-10-CM | POA: Diagnosis not present

## 2024-03-08 DIAGNOSIS — K7290 Hepatic failure, unspecified without coma: Secondary | ICD-10-CM | POA: Diagnosis not present

## 2024-03-08 DIAGNOSIS — Z8613 Personal history of malaria: Secondary | ICD-10-CM | POA: Diagnosis not present

## 2024-03-08 DIAGNOSIS — I10 Essential (primary) hypertension: Secondary | ICD-10-CM | POA: Diagnosis not present

## 2024-03-08 DIAGNOSIS — K7469 Other cirrhosis of liver: Secondary | ICD-10-CM | POA: Diagnosis not present

## 2024-03-08 DIAGNOSIS — K7460 Unspecified cirrhosis of liver: Secondary | ICD-10-CM | POA: Diagnosis not present

## 2024-03-08 DIAGNOSIS — R188 Other ascites: Secondary | ICD-10-CM | POA: Diagnosis not present

## 2024-03-08 DIAGNOSIS — K7682 Hepatic encephalopathy: Secondary | ICD-10-CM | POA: Diagnosis not present

## 2024-03-08 DIAGNOSIS — Z7185 Encounter for immunization safety counseling: Secondary | ICD-10-CM | POA: Diagnosis not present

## 2024-03-08 DIAGNOSIS — B169 Acute hepatitis B without delta-agent and without hepatic coma: Secondary | ICD-10-CM | POA: Diagnosis not present

## 2024-03-08 DIAGNOSIS — T375X6A Underdosing of antiviral drugs, initial encounter: Secondary | ICD-10-CM | POA: Diagnosis not present

## 2024-03-08 DIAGNOSIS — E871 Hypo-osmolality and hyponatremia: Secondary | ICD-10-CM | POA: Diagnosis not present

## 2024-03-08 DIAGNOSIS — R7303 Prediabetes: Secondary | ICD-10-CM | POA: Diagnosis not present

## 2024-03-08 DIAGNOSIS — K648 Other hemorrhoids: Secondary | ICD-10-CM | POA: Diagnosis not present

## 2024-03-08 DIAGNOSIS — K5900 Constipation, unspecified: Secondary | ICD-10-CM | POA: Diagnosis not present

## 2024-03-09 DIAGNOSIS — K7460 Unspecified cirrhosis of liver: Secondary | ICD-10-CM | POA: Diagnosis not present

## 2024-03-10 DIAGNOSIS — K7460 Unspecified cirrhosis of liver: Secondary | ICD-10-CM | POA: Diagnosis not present

## 2024-03-11 DIAGNOSIS — K7290 Hepatic failure, unspecified without coma: Secondary | ICD-10-CM | POA: Diagnosis not present

## 2024-03-11 DIAGNOSIS — B181 Chronic viral hepatitis B without delta-agent: Secondary | ICD-10-CM | POA: Diagnosis not present

## 2024-03-11 DIAGNOSIS — K7460 Unspecified cirrhosis of liver: Secondary | ICD-10-CM | POA: Diagnosis not present

## 2024-03-12 DIAGNOSIS — K7290 Hepatic failure, unspecified without coma: Secondary | ICD-10-CM | POA: Diagnosis not present

## 2024-03-12 DIAGNOSIS — D689 Coagulation defect, unspecified: Secondary | ICD-10-CM | POA: Diagnosis not present

## 2024-03-12 DIAGNOSIS — Z01818 Encounter for other preprocedural examination: Secondary | ICD-10-CM | POA: Diagnosis not present

## 2024-03-12 DIAGNOSIS — K7460 Unspecified cirrhosis of liver: Secondary | ICD-10-CM | POA: Diagnosis not present

## 2024-03-12 DIAGNOSIS — Z7185 Encounter for immunization safety counseling: Secondary | ICD-10-CM | POA: Diagnosis not present

## 2024-03-12 DIAGNOSIS — B181 Chronic viral hepatitis B without delta-agent: Secondary | ICD-10-CM | POA: Diagnosis not present

## 2024-03-13 DIAGNOSIS — K7290 Hepatic failure, unspecified without coma: Secondary | ICD-10-CM | POA: Diagnosis not present

## 2024-03-13 DIAGNOSIS — K7460 Unspecified cirrhosis of liver: Secondary | ICD-10-CM | POA: Diagnosis not present

## 2024-03-13 DIAGNOSIS — D689 Coagulation defect, unspecified: Secondary | ICD-10-CM | POA: Diagnosis not present

## 2024-03-13 DIAGNOSIS — B181 Chronic viral hepatitis B without delta-agent: Secondary | ICD-10-CM | POA: Diagnosis not present

## 2024-03-14 ENCOUNTER — Telehealth: Payer: Self-pay | Admitting: Internal Medicine

## 2024-03-14 DIAGNOSIS — K7290 Hepatic failure, unspecified without coma: Secondary | ICD-10-CM | POA: Diagnosis not present

## 2024-03-14 DIAGNOSIS — B181 Chronic viral hepatitis B without delta-agent: Secondary | ICD-10-CM | POA: Diagnosis not present

## 2024-03-14 DIAGNOSIS — K7460 Unspecified cirrhosis of liver: Secondary | ICD-10-CM | POA: Diagnosis not present

## 2024-03-14 NOTE — Telephone Encounter (Signed)
 Pt's wife called to cancel upcoming hsfu due to Georges being hospitalized in [place]. Pt may need a liver transplant. Wife wanted to update provider.

## 2024-03-15 ENCOUNTER — Inpatient Hospital Stay: Admitting: Internal Medicine

## 2024-03-19 ENCOUNTER — Telehealth: Payer: Self-pay

## 2024-03-19 DIAGNOSIS — R748 Abnormal levels of other serum enzymes: Secondary | ICD-10-CM | POA: Diagnosis not present

## 2024-03-19 DIAGNOSIS — K7469 Other cirrhosis of liver: Secondary | ICD-10-CM | POA: Diagnosis not present

## 2024-03-19 DIAGNOSIS — K7460 Unspecified cirrhosis of liver: Secondary | ICD-10-CM

## 2024-03-19 DIAGNOSIS — B181 Chronic viral hepatitis B without delta-agent: Secondary | ICD-10-CM

## 2024-03-19 DIAGNOSIS — B179 Acute viral hepatitis, unspecified: Secondary | ICD-10-CM | POA: Diagnosis not present

## 2024-03-20 ENCOUNTER — Telehealth: Payer: Self-pay

## 2024-03-20 NOTE — Progress Notes (Signed)
 Thank you for this referral. The [facility] team receives referrals for patients who meet Complex Care Management program criteria: chronic conditions including heart failure, stroke, COPD, ESRD, Sickle Cell, Diabetes with complications, Mental/Behavioral Health diagnosis, substance abuse/misuse and whose Primary Care Provider is a [facility] provider or [facility]CO contracted Building control surveyor in [place]).  This patient does not have a [facility] or [facility] Primary Care Provider. [facility] Population [facility] cannot directly provide Care Management services to patients who do not have a qualifying Primary Care Provider. Please call us  if you need further clarification at [phone].    Patients wife advised to update plan as patient sees [facility] for PCP.  Dreama [facility]  [facility], [facility] Assistant Direct Dial: [phone]  Fax: [phone]

## 2024-03-23 DIAGNOSIS — Z5941 Food insecurity: Secondary | ICD-10-CM | POA: Diagnosis not present

## 2024-03-23 DIAGNOSIS — K7460 Unspecified cirrhosis of liver: Secondary | ICD-10-CM | POA: Diagnosis not present

## 2024-03-23 DIAGNOSIS — K7290 Hepatic failure, unspecified without coma: Secondary | ICD-10-CM | POA: Diagnosis not present

## 2024-03-23 DIAGNOSIS — R4589 Other symptoms and signs involving emotional state: Secondary | ICD-10-CM | POA: Diagnosis not present

## 2024-03-23 DIAGNOSIS — R110 Nausea: Secondary | ICD-10-CM | POA: Diagnosis not present

## 2024-03-26 DIAGNOSIS — K7469 Other cirrhosis of liver: Secondary | ICD-10-CM | POA: Diagnosis not present

## 2024-03-26 DIAGNOSIS — B179 Acute viral hepatitis, unspecified: Secondary | ICD-10-CM | POA: Diagnosis not present

## 2024-03-26 DIAGNOSIS — R748 Abnormal levels of other serum enzymes: Secondary | ICD-10-CM | POA: Diagnosis not present

## 2024-04-02 DIAGNOSIS — R748 Abnormal levels of other serum enzymes: Secondary | ICD-10-CM | POA: Diagnosis not present

## 2024-04-02 DIAGNOSIS — B179 Acute viral hepatitis, unspecified: Secondary | ICD-10-CM | POA: Diagnosis not present

## 2024-04-02 DIAGNOSIS — K7469 Other cirrhosis of liver: Secondary | ICD-10-CM | POA: Diagnosis not present

## 2024-04-02 DIAGNOSIS — B181 Chronic viral hepatitis B without delta-agent: Secondary | ICD-10-CM | POA: Diagnosis not present

## 2024-04-10 DIAGNOSIS — E441 Mild protein-calorie malnutrition: Secondary | ICD-10-CM | POA: Diagnosis not present

## 2024-04-10 DIAGNOSIS — K529 Noninfective gastroenteritis and colitis, unspecified: Secondary | ICD-10-CM | POA: Diagnosis not present

## 2024-04-10 DIAGNOSIS — K7469 Other cirrhosis of liver: Secondary | ICD-10-CM | POA: Diagnosis not present

## 2024-04-10 DIAGNOSIS — B181 Chronic viral hepatitis B without delta-agent: Secondary | ICD-10-CM | POA: Diagnosis not present

## 2024-04-10 DIAGNOSIS — L299 Pruritus, unspecified: Secondary | ICD-10-CM | POA: Diagnosis not present

## 2024-04-10 DIAGNOSIS — B179 Acute viral hepatitis, unspecified: Secondary | ICD-10-CM | POA: Diagnosis not present

## 2024-04-10 DIAGNOSIS — K7682 Hepatic encephalopathy: Secondary | ICD-10-CM | POA: Diagnosis not present

## 2024-04-16 DIAGNOSIS — K7469 Other cirrhosis of liver: Secondary | ICD-10-CM | POA: Diagnosis not present

## 2024-04-16 DIAGNOSIS — B181 Chronic viral hepatitis B without delta-agent: Secondary | ICD-10-CM | POA: Diagnosis not present

## 2024-04-16 DIAGNOSIS — R748 Abnormal levels of other serum enzymes: Secondary | ICD-10-CM | POA: Diagnosis not present

## 2024-04-16 DIAGNOSIS — B179 Acute viral hepatitis, unspecified: Secondary | ICD-10-CM | POA: Diagnosis not present

## 2024-04-23 DIAGNOSIS — B181 Chronic viral hepatitis B without delta-agent: Secondary | ICD-10-CM | POA: Diagnosis not present

## 2024-04-23 DIAGNOSIS — K7469 Other cirrhosis of liver: Secondary | ICD-10-CM | POA: Diagnosis not present

## 2024-04-23 DIAGNOSIS — R748 Abnormal levels of other serum enzymes: Secondary | ICD-10-CM | POA: Diagnosis not present

## 2024-04-23 DIAGNOSIS — B179 Acute viral hepatitis, unspecified: Secondary | ICD-10-CM | POA: Diagnosis not present

## 2024-04-30 DIAGNOSIS — B179 Acute viral hepatitis, unspecified: Secondary | ICD-10-CM | POA: Diagnosis not present

## 2024-04-30 DIAGNOSIS — B181 Chronic viral hepatitis B without delta-agent: Secondary | ICD-10-CM | POA: Diagnosis not present

## 2024-04-30 DIAGNOSIS — K7469 Other cirrhosis of liver: Secondary | ICD-10-CM | POA: Diagnosis not present

## 2024-04-30 DIAGNOSIS — R748 Abnormal levels of other serum enzymes: Secondary | ICD-10-CM | POA: Diagnosis not present

## 2024-05-07 DIAGNOSIS — K7469 Other cirrhosis of liver: Secondary | ICD-10-CM | POA: Diagnosis not present

## 2024-05-07 DIAGNOSIS — R748 Abnormal levels of other serum enzymes: Secondary | ICD-10-CM | POA: Diagnosis not present

## 2024-05-07 DIAGNOSIS — B179 Acute viral hepatitis, unspecified: Secondary | ICD-10-CM | POA: Diagnosis not present

## 2024-05-07 DIAGNOSIS — B181 Chronic viral hepatitis B without delta-agent: Secondary | ICD-10-CM | POA: Diagnosis not present

## 2024-05-14 DIAGNOSIS — R748 Abnormal levels of other serum enzymes: Secondary | ICD-10-CM | POA: Diagnosis not present

## 2024-05-14 DIAGNOSIS — K7469 Other cirrhosis of liver: Secondary | ICD-10-CM | POA: Diagnosis not present

## 2024-05-14 DIAGNOSIS — B179 Acute viral hepatitis, unspecified: Secondary | ICD-10-CM | POA: Diagnosis not present

## 2024-06-08 ENCOUNTER — Other Ambulatory Visit (HOSPITAL_COMMUNITY): Payer: Self-pay
